# Patient Record
Sex: Female | Born: 1980 | Race: White | Hispanic: No | Marital: Single | Smoking: Former smoker
Health system: Southern US, Community
[De-identification: ages and names within clinical notes are randomized; demographics above are authoritative.]

## PROBLEM LIST (undated history)

## (undated) DIAGNOSIS — I10 Essential (primary) hypertension: Secondary | ICD-10-CM

## (undated) DIAGNOSIS — E119 Type 2 diabetes mellitus without complications: Secondary | ICD-10-CM

## (undated) HISTORY — PX: TUBAL LIGATION: SHX77

---

## 2004-07-08 ENCOUNTER — Emergency Department: Payer: Self-pay | Admitting: Emergency Medicine

## 2005-08-31 ENCOUNTER — Emergency Department: Payer: Self-pay | Admitting: Emergency Medicine

## 2006-02-18 ENCOUNTER — Emergency Department: Payer: Self-pay | Admitting: Emergency Medicine

## 2006-07-13 ENCOUNTER — Emergency Department: Payer: Self-pay | Admitting: Emergency Medicine

## 2007-03-19 ENCOUNTER — Emergency Department: Payer: Self-pay

## 2007-11-23 ENCOUNTER — Emergency Department: Payer: Self-pay | Admitting: Emergency Medicine

## 2008-09-10 ENCOUNTER — Emergency Department: Payer: Self-pay | Admitting: Emergency Medicine

## 2009-05-10 ENCOUNTER — Emergency Department: Payer: Self-pay | Admitting: Emergency Medicine

## 2010-04-08 ENCOUNTER — Emergency Department: Payer: Self-pay | Admitting: Emergency Medicine

## 2011-07-16 ENCOUNTER — Emergency Department: Payer: Self-pay | Admitting: Emergency Medicine

## 2013-04-18 LAB — CBC WITH DIFFERENTIAL/PLATELET
Basophil #: 0.1 10*3/uL (ref 0.0–0.1)
Basophil %: 0.7 %
Eosinophil #: 0.2 10*3/uL (ref 0.0–0.7)
Eosinophil %: 1.9 %
HCT: 40.8 % (ref 35.0–47.0)
HGB: 14 g/dL (ref 12.0–16.0)
Lymphocyte #: 3.7 10*3/uL — ABNORMAL HIGH (ref 1.0–3.6)
Lymphocyte %: 28.7 %
MCH: 31 pg (ref 26.0–34.0)
MCHC: 34.3 g/dL (ref 32.0–36.0)
MCV: 91 fL (ref 80–100)
Monocyte #: 0.8 x10 3/mm (ref 0.2–0.9)
Monocyte %: 6.3 %
Neutrophil #: 7.9 10*3/uL — ABNORMAL HIGH (ref 1.4–6.5)
Neutrophil %: 62.4 %
Platelet: 253 10*3/uL (ref 150–440)
RBC: 4.52 10*6/uL (ref 3.80–5.20)
RDW: 13.3 % (ref 11.5–14.5)
WBC: 12.7 10*3/uL — ABNORMAL HIGH (ref 3.6–11.0)

## 2013-04-18 LAB — BASIC METABOLIC PANEL
Anion Gap: 6 — ABNORMAL LOW (ref 7–16)
BUN: 11 mg/dL (ref 7–18)
Calcium, Total: 9.3 mg/dL (ref 8.5–10.1)
Chloride: 101 mmol/L (ref 98–107)
Co2: 27 mmol/L (ref 21–32)
Creatinine: 0.51 mg/dL — ABNORMAL LOW (ref 0.60–1.30)
EGFR (African American): >60
EGFR (Non-African Amer.): >60
Glucose: 280 mg/dL — ABNORMAL HIGH (ref 65–99)
Osmolality: 278 (ref 275–301)
Potassium: 3.6 mmol/L (ref 3.5–5.1)
Sodium: 134 mmol/L — ABNORMAL LOW (ref 136–145)

## 2013-04-19 ENCOUNTER — Inpatient Hospital Stay: Payer: Self-pay | Admitting: Internal Medicine

## 2013-04-20 LAB — CBC WITH DIFFERENTIAL/PLATELET
Basophil #: 0 10*3/uL (ref 0.0–0.1)
Basophil %: 0.5 %
Eosinophil #: 0 10*3/uL (ref 0.0–0.7)
Eosinophil %: 0.3 %
HCT: 35.6 % (ref 35.0–47.0)
HGB: 12.3 g/dL (ref 12.0–16.0)
Lymphocyte #: 0.5 10*3/uL — ABNORMAL LOW (ref 1.0–3.6)
Lymphocyte %: 11.4 %
MCH: 31 pg (ref 26.0–34.0)
MCHC: 34.7 g/dL (ref 32.0–36.0)
MCV: 90 fL (ref 80–100)
Monocyte #: 0.4 x10 3/mm (ref 0.2–0.9)
Monocyte %: 8.1 %
Neutrophil #: 3.6 10*3/uL (ref 1.4–6.5)
Neutrophil %: 79.7 %
Platelet: 187 10*3/uL (ref 150–440)
RBC: 3.97 10*6/uL (ref 3.80–5.20)
RDW: 13.2 % (ref 11.5–14.5)
WBC: 4.5 10*3/uL (ref 3.6–11.0)

## 2013-04-20 LAB — BASIC METABOLIC PANEL
Anion Gap: 8 (ref 7–16)
BUN: 7 mg/dL (ref 7–18)
Calcium, Total: 8.8 mg/dL (ref 8.5–10.1)
Chloride: 101 mmol/L (ref 98–107)
Co2: 26 mmol/L (ref 21–32)
Creatinine: 0.51 mg/dL — ABNORMAL LOW (ref 0.60–1.30)
EGFR (African American): >60
EGFR (Non-African Amer.): >60
Glucose: 246 mg/dL — ABNORMAL HIGH (ref 65–99)
Osmolality: 276 (ref 275–301)
Potassium: 3.9 mmol/L (ref 3.5–5.1)
Sodium: 135 mmol/L — ABNORMAL LOW (ref 136–145)

## 2013-04-20 LAB — HEMOGLOBIN A1C: Hemoglobin A1C: 10.7 % — ABNORMAL HIGH (ref 4.2–6.3)

## 2013-04-20 LAB — LIPID PANEL
Cholesterol: 130 mg/dL (ref 0–200)
HDL Cholesterol: 24 mg/dL — ABNORMAL LOW (ref 40–60)
Ldl Cholesterol, Calc: 76 mg/dL (ref 0–100)
Triglycerides: 150 mg/dL (ref 0–200)
VLDL Cholesterol, Calc: 30 mg/dL (ref 5–40)

## 2013-04-20 LAB — PREGNANCY, URINE: Pregnancy Test, Urine: NEGATIVE m[IU]/mL

## 2014-05-15 ENCOUNTER — Emergency Department: Payer: Self-pay | Admitting: Emergency Medicine

## 2014-12-06 NOTE — Op Note (Signed)
PATIENT NAME:  Ariana Lindsey, Ariana Lindsey MR#:  [id number] DATE OF BIRTH:  [birth year]  DATE OF PROCEDURE:  04/20/2013  PREOPERATIVE DIAGNOSIS:  Soft tissue abscess of mons pubis.   POSTOPERATIVE DIAGNOSIS:  Soft tissue abscess of mons pubis.   PROCEDURE PERFORMED:  Incision and drainage of abscess on mons pubis.   SURGEON:  Emery Dupuy A. Marina Gravel, M.D.   ASSISTANTS:  None.   ANESTHESIA:  General endotracheal.   FINDINGS:  Pus.   SPECIMENS:  None.   DRAINS:  Penrose drain.   DESCRIPTION OF PROCEDURE:  With informed consent, supine position, general anesthesia, the area around the abscess on the lower abdomen and mons pubis was sterilely prepped and draped with Betadine solution, time-out was observed. There was one large area of necrotic skin. Probing of which demonstrated foul pus. Pus was aspirated. The wound was irrigated. The depth of the wound was approximately 5 cm. It tracked inferiorly and to the right. A counter incision was fashioned. A Penrose drain was placed between these two points. The drains were secured with a nylon suture. Hemostasis was ensured on the operative field, and the patient was then subsequently extubated and taken to the Recovery Room in stable and satisfactory condition by anesthesia services.   ____________________________ Jeannette How Marina Gravel, MD mab:jm D: 04/20/2013 10:41:00 ET T: 04/20/2013 10:53:04 ET JOB#: [id number]  cc: Elta Guadeloupe A. Marina Gravel, MD, <Dictator> Hortencia Conradi MD ELECTRONICALLY SIGNED 04/24/2013 9:45

## 2014-12-06 NOTE — Discharge Summary (Signed)
PATIENT NAME:  Ariana Lindsey, Ariana Lindsey MR#:  [id number] DATE OF BIRTH:  01-29-1981  DATE OF ADMISSION:  04/19/2013 DATE OF DISCHARGE:  04/21/2013  DISCHARGE DIAGNOSES: 1.  Mons pubis abscess status post drainage.  2.  Diabetes mellitus type 2.  3.  Obesity.   DISCHARGE MEDICATIONS: 1.  Glipizide 5 mg p.o. b.i.d. 2.  Metformin 5 mg p.o. b.i.d. 3.  Cleocin 300 mg q. 6 hours for 10 days.   DIET:  ADA diet.   CONSULTATIONS:  Surgical consult with Dr. Marina Gravel.   HOSPITAL COURSE: This is a 34 year old female patient with a history of diabetes before stopped taking medications as she lost some weight, came in because of swelling around the mons pubis area, gradually gotten worse with fever and nausea. The patient has a history of boils underarms and also along groin area. Usually they resolve by itself but this abscess is going on for a week and also associated significant pain. Admitted for soft tissue swelling, cellulitis of mons pubis area. Initially, the patient started on IV clindamycin, was then admitted to medical floor ,obgyn consult  obtained and also surgical consult obtained. CAT scan of the abdomen and pelvis showed about inflammation around mons pubis area without abscess. White count on admission was 12.7. The patient continued to spike fever, 102, and maximum fever was 103. The patient seen by Dr. Burt Knack initially and taken to OR by Dr. Marina Gravel yesterday; and the patient had a large area of necrotic skin andlot of pus was drained  from there,and .   wound was around 5 cm.deep/ The patient's wound was irrigated and a Penrose drain was placed between these 2 points. The patient tolerated the procedure well, monitored for about 24 hours after the procedure; and since surgery, the patient did not have any fevers, stayed hemodynamically stable and the patient's pain is better.  Redness and swelling at the mons pubis area also improved.  Seen by surgery today.  Recommended that she can go home and followup  with Dr. Marina Gravel on September 11. The patient's cellulitis is nearly resolved.  So we discharged her with clindamycin.   Diabetes mellitus type II.  The patient stopped taking her metformin and glipizide a few years ago due to the fact that she lost weight.  But here we got hemoglobin A1c to around 10.  The patient's hemoglobin Alc 10.7. LDL is at goal. Started back on metformin and glipizide. Told her that she needs to have a primary doctor and she needs follow up on diabetes and she also knows about how to check sugars and she is aware of her diabetic diet.  The patient went home in stable condition. Initially white count was 12.7, and kidney function was within normal limits. The patient's white count came back to 4.7 September 5.  TIME SPENT ON DISCHARGE PREPARATION:  More than 30 minutes.    ____________________________ Epifanio Lesches, MD sk:ce D: 04/21/2013 14:12:30 ET T: 04/21/2013 15:03:32 ET JOB#: [id number]  cc: Epifanio Lesches, MD, <Dictator> Mark A. Marina Gravel, MD  Epifanio Lesches MD ELECTRONICALLY SIGNED 05/06/2013 15:47

## 2014-12-06 NOTE — Consult Note (Signed)
PATIENT NAME:  Ariana Lindsey, Ariana Lindsey MR#:  [id number] DATE OF BIRTH:  [birth year]  DATE OF CONSULTATION:  04/19/2013  CONSULTING PHYSICIAN:  Jerrol Banana. Burt Knack, MD  CHIEF COMPLAINT: Draining abscesses of the pubis.    HISTORY OF PRESENT ILLNESS: This is a 34 year old morbidly obese female patient with a history of multiple boils in the past, but over the last week, she has had increasing pain, swelling, fevers and spontaneous drainage. She has had packing placed into two separate ares on the pubis in the Emergency Room yesterday. She continues to have pain, states that she can barely walk at this point. She has had abscess or boils in the past, but it has never been this bad.    PAST MEDICAL HISTORY: Hypertension and diabetes and obesity.   PAST SURGICAL HISTORY: None.   ALLERGIES: None.   MEDICATIONS: Multiple, see chart and reconciliation.   FAMILY HISTORY: Noncontributory.   PAST SURGICAL HISTORY: C-section and tubal ligation.   SOCIAL HISTORY: The patient smokes 1/2 pack of cigarettes per day. Rarely drinks alcohol.   REVIEW OF SYSTEMS: A 10-system review is performed and negative with the exception that mentioned in the history of present illness.   PHYSICAL EXAMINATION: GENERAL: Obese female patient with a BMI of 39, 240 pounds, 66 inches.  VITAL SIGNS: Temperature 100.9, pulse 102, respirations 20, blood pressure 128/82, 96% room air sat. HEENT: No scleral icterus.  NECK: No palpable neck nodes.  CHEST: Clear to auscultation.  CARDIAC: Regular rate and rhythm.  ABDOMEN: Obese, soft, nontender. In the pubic area there is extensive erythema, induration, two spontaneous drainage appear to have been packed with 0.25 inch Nu Gauze. There is a third and possibly even a fourth area that looks like it is fluctuant and possibly ready point and considerable induration and tenderness with erythema.  EXTREMITIES: Without edema. Calves are nontender.  NEUROLOGIC: Grossly intact.  INTEGUMENT:  Otherwise shows no jaundice.   LABORATORY VALUES:  White blood cell count of 12.7, hemoglobin and hematocrit of 14 and 41 and a platelet count of 253. Electrolytes are within normal limits.   ASSESSMENT AND PLAN: This is a patient with multiple abscesses of the mons pubis. Recommend taking the patient the operating room for formal drainage. I suspect that these connect the deeper space, but nonetheless will require drainage because of the multiplicity and the severity with her diabetes and obesity and tobacco abuse. The options were discussed with her as was the rationale for offering this approach and the risks of bleeding, infection, open wound, cosmetic deformity and recurrence of symptoms were all reviewed with her. She understood and agreed to proceed.   I also notified her that this will be scheduled for tomorrow morning. Nothing oral orders were placed and this will be scheduled with Dr. Marina Gravel.    ____________________________ Jerrol Banana. Burt Knack, MD rec:cc D: 04/19/2013 21:18:35 ET T: 04/19/2013 22:19:37 ET JOB#: [id number]  cc: Jerrol Banana. Burt Knack, MD, <Dictator> Florene Glen MD ELECTRONICALLY SIGNED 04/19/2013 22:43

## 2014-12-06 NOTE — Consult Note (Signed)
Brief Consult Note: Diagnosis: Cellulitis of mons pubis.   Patient was seen by consultant.   Consult note dictated.   Recommend further assessment or treatment.   Discussed with Attending MD.   Comments: Gen Surgery consult as well due to risk for necrosis, possible need for debridement.  Electronic Signatures: Hoyt Koch (MD)  (Signed 04-Sep-14 09:25)  Authored: Brief Consult Note   Last Updated: 04-Sep-14 09:25 by Hoyt Koch (MD)

## 2014-12-06 NOTE — H&P (Signed)
PATIENT NAME:  Ariana Lindsey, Ariana Lindsey MR#:  [id number] DATE OF BIRTH:  [birth year]  DATE OF ADMISSION:  04/19/2013  PRIMARY CARE PHYSICIAN:  Nonlocal.   REFERRING PHYSICIAN:  Dr. Lovena Le.   CHIEF COMPLAINT:  Redness and swelling of the mons pubis.   HISTORY OF PRESENT ILLNESS:  The patient is a 34 year old Caucasian female with past medical history of diabetes mellitus and hypertension, has noticed a small pustular area on the mons pubis approximately one week ago, slowly it became more erythematous, tender and swollen.  Subsequently, the patient started having difficulty with ambulation.  This is associated with fever and nausea.  She is reporting that she is prone for abscess formation and last year she was diagnosed with abscess 3 times.  She denies any history of MRSA.  She usually gets abscesses in the axillary area and groin area.  She denies any vaginal discharge.  No similar complaints in the past.  The patient denies any trauma other areas, but reporting that she has shaved that area approximately two weeks ago.  CAT scan of the pelvis without IV contrast has revealed inflammatory changes in the mons pubis without abscess.  Findings are consistent with cellulitis.  Hospitalist team is called to admit the patient.  The patient has received IV clindamycin in the ER, although pain was alleviated with IV morphine.  During my examination, with the help of chaperone ER nurse Mr. Warner Mccreedy, the patient's pain is much better and started feeling better.  No family members were at bedside.   PAST MEDICAL HISTORY:  Diabetes mellitus, hypertension.   PAST SURGICAL HISTORY:  C-section, tubal ligation.   ALLERGIES:  She has no known drug allergies.   PSYCHOSOCIAL HISTORY:  Lives with children, smokes half pack a day.  Occasional intake of alcohol.  Denies any illicit drug usage.   FAMILY HISTORY:  Grandmother has history of diabetes mellitus and mom and dad has history of hypertension.   HOME MEDICATIONS:  Not on  any medications currently.   REVIEW OF SYSTEMS:  CONSTITUTIONAL:  Complaining of fever and fatigue.  EYES:  Denies blurry vision, glaucoma.  EARS, NOSE, THROAT:  No epistaxis or discharge.  RESPIRATION:  No cough, COPD.  CARDIOVASCULAR:  Denies chest pain, palpitations.  GASTROINTESTINAL:  Had nausea, but denies any vomiting or diarrhea.  Denies any abdominal pain.  GENITOURINARY:  No dysuria or hematuria.   GYNECOLOGIC AND BREAST:  Denies any breast mass or vaginal discharge.   ENDOCRINE:  Denies polyuria, nocturia.  Has history of diabetes mellitus.  HEMATOLOGIC AND LYMPHATIC:  Denies anemia, easy bruising.  INTEGUMENTARY:  No acne, has two open lesions in the mons pubis area associated with erythema, tenderness and fluctuance.  MUSCULOSKELETAL:  Denies any pain in the neck, back, shoulder.  Denies gout.  NEUROLOGIC:  No vertigo, ataxia.  PSYCHIATRIC:  No ADD, OCD.   PHYSICAL EXAMINATION: VITAL SIGNS:  Temperature 98.5%, pulse 92, respiration 18, blood pressure 117/72, pulse ox 96%.  GENERAL APPEARANCE:  Obese.  Moderately built and obese lady not under acute distress.  HEENT:  Normocephalic, atraumatic.  Pupils are equally reactive to light and accommodation.  No scleral icterus.  No conjunctival injection.  No sinus tenderness.  No postnasal drip.  Moist mucous membranes.  NECK:  Supple.  No JVD.  No thyromegaly.  No lymphadenopathy.  LUNGS:  Clear to auscultation bilaterally.  No accessory muscle usage.  No anterior chest wall tenderness on palpation.  CARDIAC:  S1, S2 normal.  Regular rate and  rhythm.  No murmurs.  GASTROINTESTINAL:  Soft, obese.  Bowel sounds are positive in all four quadrants.  Nontender, nondistended.  No hepatosplenomegaly.  NEUROLOGIC:  Awake, alert, oriented x 3.  Motor and sensory grossly intact.  Reflexes are 2+.  EXTREMITIES:  No edema.  No cyanosis.  No clubbing.  MUSCULOSKELETAL:  No joint effusion, tenderness, erythema.  PSYCHIATRIC:  Normal mood and  affect.  SKIN:  Warm to touch.  Normal turgor.  Two open lesions were noticed in the mons pubis area, one is 2 x 2 cm.  The other one is 1 x 0.5 cm in size.  A little purulent discharge is noted, erythematous and tender, positive fluctuance.   LABORATORY AND IMAGING STUDIES:  Glucose 280, BUN 11, creatinine 0.77, creatinine 0.51, sodium 134, potassium 3.6, chloride 111, CO2 27, anion gap 6, GFR greater than 60, serum osmolality 278, calcium 9.3.  WBC 12.7, hemoglobin 14.0, hematocrit 40.8, platelets 253.  CT of the pelvis without contrast has revealed inflammatory changes in the mons pubis without abscess.  Findings consistent with cellulitis.   ASSESSMENT AND PLAN:  A 34 year old female with swelling, erythema and pain in the mons pubis, will be admitted with the following assessment and plan.  1.  Cellulitis with soft tissue swelling of the mons pubis.  We will admit her to Med-Surg unit.  We will obtain wound cultures.  We will continue IV clindamycin and Zosyn.  We will put a gynecologic consult as the patient has vulvar edema.  2.  Diabetes mellitus.  The patient is not on any home medications.  We will check hemoglobin A1c and patient will be on insulin sliding scale.  3.  Hypertension.  The patient is not on any home medication.  We will monitor blood pressures closely and if necessary we will add medication.  4.  We will provide her GI and DVT prophylaxis.   Plan of care discussed in detail with the patient.  She is aware of the plan.     ____________________________ Nicholes Mango, MD ag:ea D: 04/19/2013 05:51:00 ET T: 04/19/2013 06:24:52 ET JOB#: [id number]  cc: Nicholes Mango, MD, <Dictator> Nicholes Mango MD ELECTRONICALLY SIGNED 04/20/2013 7:05

## 2014-12-06 NOTE — Consult Note (Signed)
Brief Consult Note: Diagnosis: multiple abscesses of pubis.   Patient was seen by consultant.   Consult note dictated.   Recommend to proceed with surgery or procedure.   Orders entered.   Comments: rec I&D risks and options discussed agrees with plan.  Electronic Signatures: Florene Glen (MD)  (Signed 04-Sep-14 21:12)  Authored: Brief Consult Note   Last Updated: 04-Sep-14 21:12 by Florene Glen (MD)

## 2014-12-06 NOTE — Consult Note (Signed)
PATIENT NAME:  Ariana Lindsey, Ariana Lindsey MR#:  [id number] DATE OF BIRTH:  Oct 12, 1980  DATE OF CONSULTATION:  04/19/2013  REFERRING PHYSICIAN:  Epifanio Lesches, MD CONSULTING PHYSICIAN:  Glean Salen, MD  HISTORY OF PRESENT ILLNESS: The patient is a 34 year old white female with a past medical history significant for diabetes and hypertension as well as frequent episodes of hidradenitis suppurativa, although she has not been seen recently by a physician for these conditions. Recently the patient has been noting some lesions of a tender erythematous nature in her mons pubis. These got to the point where they did have some drainage and she noticed some fever and nausea as well. She has had prior infections, even abscesses, with lesions of this nature and so came to the ER for further evaluation. She denies a history of MRSA. She has no other lesions at this time. The patient was seen in the Emergency Room and these lesions were drained and packed with gauze, and she also had a CT scan which ruled out any signs of abscess, although cellulitis was confirmed. The patient was placed on IV antibiotics including clindamycin and Zosyn and was admitted for further therapy. This morning the patient reports continued pain and swelling in this area, but no other complaints.   PAST MEDICAL HISTORY: Diabetes, hypertension, hidradenitis.   PAST SURGICAL HISTORY: Cesarean section, tubal ligation.  HOME MEDICATIONS: None.   ALLERGIES: No known drug allergies   SOCIAL HISTORY: The patient is a smoker. Occasional alcohol use.   FAMILY HISTORY: Diabetes and hypertension.   REVIEW OF SYSTEMS: As above. No other changes.   PHYSICAL EXAMINATION: VITAL SIGNS: Stable. No fever this morning.  GENERAL: No acute distress.  PSYCH: Alert and oriented.  ABDOMEN: Soft, nontender, nondistended, obese.  EXTREMITIES: No edema.  GENITOURINARY: The patient has significant edema in the mons pubis area, although it does not extend  into the labia or into the vagina. There is erythema to the skin in the mons pubis. There are 2 lesions that have been packed in the ER with some iodoform gauze. The midline lesion has some blackened edges on the surface. It is very tender to examine the patient in this area. There is some scant drainage at the time of examination from these sores.   LABORATORY AND DIAGNOSTICS: White blood cell 12.7.  See CT report.  ASSESSMENT: A 34 year old patient with history of hydradenitis as well as diabetes now with 2 draining lesions in the mons pubis. The patient certainly has cellulitis. There is no evidence for abscess. There is concern for necrotic skin edges on 1 of the lesions.   PLAN: I would like general surgery to also evaluate these lesions for possible need for debridement, or incision and drainage procedure. Antibiotics are agreed upon and should continue for at least 48 hours before switching to a p.o. regimen and outpatient treatment. Certainly we need to see resolution in these lesions and improvement before p.o. therapy and then outpatient therapy. I also agree that diabetes management needs to be more thoroughly undertaken, and the patient needs to be more compliant with follow up and medications. We will continue to follow the patient and follow the advice of the general surgeon through his consultation as well.  ____________________________ R. Barnett Applebaum, MD rph:sb D: 04/19/2013 09:31:12 ET T: 04/19/2013 09:43:11 ET JOB#: [id number]  cc: Glean Salen, MD, <Dictator> Gae Dry MD ELECTRONICALLY SIGNED 04/19/2013 10:37

## 2015-04-14 ENCOUNTER — Encounter
Admission: RE | Admit: 2015-04-14 | Discharge: 2015-04-14 | Disposition: A | Discharge status: Home / Self Care | Payer: 59 | Source: Ambulatory Visit | Attending: Obstetrics & Gynecology | Admitting: Obstetrics & Gynecology

## 2015-04-14 DIAGNOSIS — Z01818 Encounter for other preprocedural examination: Secondary | ICD-10-CM | POA: Diagnosis not present

## 2015-04-14 HISTORY — DX: Type 2 diabetes mellitus without complications: E11.9

## 2015-04-14 HISTORY — DX: Essential (primary) hypertension: I10

## 2015-04-14 LAB — TYPE AND SCREEN
ABO/RH(D): A POS
Antibody Screen: NEGATIVE

## 2015-04-14 LAB — BASIC METABOLIC PANEL
Anion gap: 9 (ref 5–15)
BUN: 14 mg/dL (ref 6–20)
CO2: 24 mmol/L (ref 22–32)
Calcium: 9.3 mg/dL (ref 8.9–10.3)
Chloride: 101 mmol/L (ref 101–111)
Creatinine, Ser: 0.52 mg/dL (ref 0.44–1.00)
GFR calc Af Amer: >60 mL/min (ref >60)
GFR calc non Af Amer: >60 mL/min (ref >60)
Glucose, Bld: 362 mg/dL — ABNORMAL HIGH (ref 65–99)
Potassium: 4.2 mmol/L (ref 3.5–5.1)
Sodium: 134 mmol/L — ABNORMAL LOW (ref 135–145)

## 2015-04-14 LAB — CBC
HCT: 45 % (ref 35.0–47.0)
Hemoglobin: 15 g/dL (ref 12.0–16.0)
MCH: 29.8 pg (ref 26.0–34.0)
MCHC: 33.4 g/dL (ref 32.0–36.0)
MCV: 89.3 fL (ref 80.0–100.0)
Platelets: 278 10*3/uL (ref 150–440)
RBC: 5.04 MIL/uL (ref 3.80–5.20)
RDW: 13.5 % (ref 11.5–14.5)
WBC: 9.4 10*3/uL (ref 3.6–11.0)

## 2015-04-14 LAB — ABO/RH: ABO/RH(D): A POS

## 2015-04-14 NOTE — Patient Instructions (Signed)
Your procedure is scheduled on: Thursday 04/17/2015 Report to Day Surgery. 2ND FLOOR MEDICAL MALL ENTRANCE To find out your arrival time please call [phone] between 1PM - 3PM on Wednesday 04/16/2015.  Remember: Instructions that are not followed completely may result in serious medical risk, up to and including death, or upon the discretion of your surgeon and anesthesiologist your surgery may need to be rescheduled.    __X__ 1. Do not eat food or drink liquids after midnight. No gum chewing or hard candies.     __X__ 2. No Alcohol for 24 hours before or after surgery.   ____ 3. Bring all medications with you on the day of surgery if instructed.    __X__ 4. Notify your doctor if there is any change in your medical condition     (cold, fever, infections).     Do not wear jewelry, make-up, hairpins, clips or nail polish.  Do not wear lotions, powders, or perfumes.   Do not shave 48 hours prior to surgery. Men may shave face and neck.  Do not bring valuables to the hospital.    [facility] is not responsible for any belongings or valuables.               Contacts, dentures or bridgework may not be worn into surgery.  Leave your suitcase in the car. After surgery it may be brought to your room.  For patients admitted to the hospital, discharge time is determined by your                treatment team.   Patients discharged the day of surgery will not be allowed to drive home.   Please read over the following fact sheets that you were given:   Surgical Site Infection Prevention   ____ Take these medicines the morning of surgery with A SIP OF WATER:    1.   2.   3.   4.  5.  6.  ____ Fleet Enema (as directed)   __X__ Use CHG Soap as directed  ____ Use inhalers on the day of surgery  __X__ Stop metformin 2 days prior to surgery    ____ Take 1/2 of usual insulin dose the night before surgery and none on the morning of surgery.   ____ Stop Coumadin/Plavix/aspirin on   _  __ Stop Anti-inflammatories on    ____ Stop supplements until after surgery.    ____ Bring C-Pap to the hospital.   Pain Relief Preoperatively and Postoperatively Being a good patient does not mean being a silent one.If you have questions, problems, or concerns about the pain you may feel after surgery, let your caregiver know.Patients have the right to assessment and management of pain. The treatment of pain after surgery is important to speed up recovery and return to normal activities. Severe pain after surgery, and the fear or anxiety associated with that pain, may cause extreme discomfort that:  Prevents sleep.  Decreases the ability to breathe deeply and cough. This can cause pneumonia or other upper airway infections.  Causes your heart to beat faster and your blood pressure to be higher.  Increases the risk for constipation and bloating.  Decreases the ability of wounds to heal.  May result in depression, increased anxiety, and feelings of helplessness. Relief of pain before surgery is also important because it will lessen the pain after surgery. Patients who receive both pain relief before and after surgery experience greater pain relief than those who only receive  pain relief after surgery. Let your caregiver know if you are having uncontrolled pain.This is very important.Pain after surgery is more difficult to manage if it is permitted to become severe, so prompt and adequate treatment of acute pain is necessary. PAIN CONTROL METHODS Your caregivers follow policies and procedures about the management of patient pain.These guidelines should be explained to you before surgery.Plans for pain control after surgery must be mutually decided upon and instituted with your full understanding and agreement.Do not be afraid to ask questions regarding the care you are receiving.There are many different ways your caregivers will attempt to control your pain, including the following  methods. As needed pain control  You may be given pain medicine either through your intravenous (IV) tube, or as a pill or liquid you can swallow. You will need to let your caregiver know when you are having pain. Then, your caregiver will give you the pain medicine ordered for you.  Your pain medicine may make you constipated. If constipation occurs, drink more liquids if you can. Your caregiver may have you take a mild laxative. IV patient-controlled analgesia pump (PCA pump)  You can get your pain medicine through the IV tube which goes into your vein. You are able to control the amount of pain medicine that you get. The pain medicine flows in through an IV tube and is controlled by a pump. This pump gives you a set amount of pain medicine when you push the button hooked up to it. Nobody should push this button but you or someone specifically assigned by you to do so. It is set up to keep you from accidentally giving yourself too much pain medicine. You will be able to start using your pain pump in the recovery room after your surgery. This method can be helpful for most types of surgery.  If you are still having too much pain, tell your caregiver. Also, tell your caregiver if you are feeling too sleepy or nauseous. Continuous epidural pain control  A thin, soft tube (catheter) is put into your back. Pain medicine flows through the catheter to lessen pain in the part of your body where the surgery is done. Continuous epidural pain control may work best for you if you are having surgery on your chest, abdomen, hip area, or legs. The epidural catheter is usually put into your back just before surgery. The catheter is left in until you can eat and take medicine by mouth. In most cases, this may take 2 to 3 days.  Giving pain medicine through the epidural catheter may help you heal faster because:  Your bowel gets back to normal faster.  You can get back to eating sooner.  You can be up and  walking sooner. Medicine that numbs the area (local anesthetic)  You may receive an injection of pain medicine near where the pain is (local infiltration).  You may receive an injection of pain medicine near the nerve that controls the sensation to a specific part of the body (peripheral nerve block).  Medicine may be put in the spine to block pain (spinal block). Opioids  Moderate to moderately severe acute pain after surgery may respond to opioids.Opioids are narcotic pain medicine. Opioids are often combined with non-narcotic medicines to improve pain relief, diminish the risk of side effects, and reduce the chance of addiction.  If you follow your caregiver's directions about taking opioids and you do not have a history of substance abuse, your risk of becoming addicted is  exceptionally small.Opioids are given for short periods of time in careful doses to prevent addiction. Other methods of pain control include:  Steroids.  Physical therapy.  Heat and cold therapy.  Compression, such as wrapping an elastic bandage around the area of pain.  Massage. These various ways of controlling pain may be used together. Combining different methods of pain control is called multimodal analgesia. Using this approach has many benefits, including being able to eat, move around, and leave the hospital sooner. Document Released: 10/23/2002 Document Revised: 10/25/2011 Document Reviewed: 10/27/2010 [facility] Patient Information 2015 [place], [place]. This information is not intended to replace advice given to you by your health care provider. Make sure you discuss any questions you have with your health care provider.

## 2015-04-15 LAB — HEMOGLOBIN A1C: Hgb A1c MFr Bld: 11.5 % — ABNORMAL HIGH (ref 4.0–6.0)

## 2015-04-17 ENCOUNTER — Ambulatory Visit: Admission: RE | Admit: 2015-04-17 | Payer: 59 | Source: Ambulatory Visit | Admitting: Obstetrics & Gynecology

## 2015-04-17 ENCOUNTER — Encounter: Admission: RE | Payer: Self-pay | Source: Ambulatory Visit

## 2015-04-17 SURGERY — HYSTERECTOMY, TOTAL, LAPAROSCOPIC
Anesthesia: Choice | Laterality: Bilateral

## 2015-05-26 ENCOUNTER — Encounter
Admission: RE | Admit: 2015-05-26 | Discharge: 2015-05-26 | Disposition: A | Discharge status: Home / Self Care | Payer: 59 | Source: Ambulatory Visit | Attending: Obstetrics & Gynecology | Admitting: Obstetrics & Gynecology

## 2015-05-26 DIAGNOSIS — Z7984 Long term (current) use of oral hypoglycemic drugs: Secondary | ICD-10-CM | POA: Diagnosis not present

## 2015-05-26 DIAGNOSIS — Z803 Family history of malignant neoplasm of breast: Secondary | ICD-10-CM | POA: Diagnosis not present

## 2015-05-26 DIAGNOSIS — Z79899 Other long term (current) drug therapy: Secondary | ICD-10-CM | POA: Diagnosis not present

## 2015-05-26 DIAGNOSIS — N838 Other noninflammatory disorders of ovary, fallopian tube and broad ligament: Secondary | ICD-10-CM | POA: Diagnosis not present

## 2015-05-26 DIAGNOSIS — Z8249 Family history of ischemic heart disease and other diseases of the circulatory system: Secondary | ICD-10-CM | POA: Diagnosis not present

## 2015-05-26 DIAGNOSIS — E119 Type 2 diabetes mellitus without complications: Secondary | ICD-10-CM | POA: Diagnosis not present

## 2015-05-26 DIAGNOSIS — N72 Inflammatory disease of cervix uteri: Secondary | ICD-10-CM | POA: Diagnosis not present

## 2015-05-26 DIAGNOSIS — Z794 Long term (current) use of insulin: Secondary | ICD-10-CM | POA: Diagnosis not present

## 2015-05-26 DIAGNOSIS — N920 Excessive and frequent menstruation with regular cycle: Secondary | ICD-10-CM | POA: Diagnosis not present

## 2015-05-26 DIAGNOSIS — D252 Subserosal leiomyoma of uterus: Secondary | ICD-10-CM | POA: Diagnosis not present

## 2015-05-26 DIAGNOSIS — F17210 Nicotine dependence, cigarettes, uncomplicated: Secondary | ICD-10-CM | POA: Diagnosis not present

## 2015-05-26 DIAGNOSIS — I10 Essential (primary) hypertension: Secondary | ICD-10-CM | POA: Diagnosis not present

## 2015-05-26 DIAGNOSIS — D251 Intramural leiomyoma of uterus: Secondary | ICD-10-CM | POA: Diagnosis not present

## 2015-05-26 DIAGNOSIS — R102 Pelvic and perineal pain: Secondary | ICD-10-CM | POA: Diagnosis present

## 2015-05-26 DIAGNOSIS — G8929 Other chronic pain: Secondary | ICD-10-CM | POA: Diagnosis not present

## 2015-05-26 LAB — BASIC METABOLIC PANEL
Anion gap: 6 (ref 5–15)
BUN: 14 mg/dL (ref 6–20)
CO2: 25 mmol/L (ref 22–32)
Calcium: 8.9 mg/dL (ref 8.9–10.3)
Chloride: 107 mmol/L (ref 101–111)
Creatinine, Ser: 0.45 mg/dL (ref 0.44–1.00)
GFR calc Af Amer: >60 mL/min (ref >60)
GFR calc non Af Amer: >60 mL/min (ref >60)
Glucose, Bld: 131 mg/dL — ABNORMAL HIGH (ref 65–99)
Potassium: 3.7 mmol/L (ref 3.5–5.1)
Sodium: 138 mmol/L (ref 135–145)

## 2015-05-26 LAB — CBC
HCT: 37.5 % (ref 35.0–47.0)
Hemoglobin: 12.6 g/dL (ref 12.0–16.0)
MCH: 30.3 pg (ref 26.0–34.0)
MCHC: 33.7 g/dL (ref 32.0–36.0)
MCV: 90 fL (ref 80.0–100.0)
Platelets: 297 10*3/uL (ref 150–440)
RBC: 4.17 MIL/uL (ref 3.80–5.20)
RDW: 13.8 % (ref 11.5–14.5)
WBC: 11.6 10*3/uL — ABNORMAL HIGH (ref 3.6–11.0)

## 2015-05-26 LAB — SURGICAL PCR SCREEN
MRSA, PCR: NEGATIVE
Staphylococcus aureus: POSITIVE — AB

## 2015-05-26 LAB — TYPE AND SCREEN
ABO/RH(D): A POS
Antibody Screen: NEGATIVE

## 2015-05-26 LAB — HEMOGLOBIN A1C: Hgb A1c MFr Bld: 8.8 % — ABNORMAL HIGH (ref 4.0–6.0)

## 2015-05-26 MED ORDER — HEPARIN SODIUM (PORCINE) 5000 UNIT/ML IJ SOLN: 5000.0000 [IU] | Freq: Once | INTRAMUSCULAR | Status: DC

## 2015-05-26 MED ORDER — CEFAZOLIN SODIUM-DEXTROSE 2-3 GM-% IV SOLR: 2.0000 g | Freq: Once | INTRAVENOUS | Status: DC

## 2015-05-26 NOTE — Patient Instructions (Addendum)
  Your procedure is scheduled on: 05/29/15 Thurs Report to Day Surgery. To find out your arrival time please call [phone] between 1PM - 3PM on 05/28/15 Wed.  Remember: Instructions that are not followed completely may result in serious medical risk, up to and including death, or upon the discretion of your surgeon and anesthesiologist your surgery may need to be rescheduled.    __x__ 1. Do not eat food or drink liquids after midnight. No gum chewing or hard candies.     ____ 2. No Alcohol for 24 hours before or after surgery.   ____ 3. Bring all medications with you on the day of surgery if instructed.    __x__ 4. Notify your doctor if there is any change in your medical condition     (cold, fever, infections).     Do not wear jewelry, make-up, hairpins, clips or nail polish.  Do not wear lotions, powders, or perfumes. You may wear deodorant.  Do not shave 48 hours prior to surgery. Men may shave face and neck.  Do not bring valuables to the hospital.    [facility] is not responsible for any belongings or valuables.               Contacts, dentures or bridgework may not be worn into surgery.  Leave your suitcase in the car. After surgery it may be brought to your room.  For patients admitted to the hospital, discharge time is determined by your                treatment team.   Patients discharged the day of surgery will not be allowed to drive home.   Please read over the following fact sheets that you were given:   MRSA booklet   ____ Take these medicines the morning of surgery with A SIP OF WATER:    1.None  2.   3.   4.  5.  6.  ____ Fleet Enema (as directed)   ____ Use CHG Soap as directed  ____ Use inhalers on the day of surgery  _x___ Stop metformin 2 days prior to surgery    ____ Take 1/2 of usual insulin dose the night before surgery and none on the morning of surgery.   ____ Stop Coumadin/Plavix/aspirin on   ____ Stop Anti-inflammatories on     ____ Stop supplements until after surgery.    ____ Bring C-Pap to the hospital.

## 2015-05-27 NOTE — OR Nursing (Signed)
Positive staph aureus called and faxed to [place] at dr wards office, paged dr ward,out of office this pm. Requested nurse give info today to doctor in office

## 2015-05-29 ENCOUNTER — Ambulatory Visit: Payer: 59 | Admitting: Anesthesiology

## 2015-05-29 ENCOUNTER — Encounter
Admission: RE | Disposition: A | Discharge status: Home / Self Care | Payer: Self-pay | Source: Ambulatory Visit | Attending: Obstetrics & Gynecology

## 2015-05-29 ENCOUNTER — Ambulatory Visit
Admission: RE | Admit: 2015-05-29 | Discharge: 2015-05-30 | Disposition: A | Discharge status: Home / Self Care | Payer: 59 | Source: Ambulatory Visit | Attending: Obstetrics & Gynecology | Admitting: Obstetrics & Gynecology

## 2015-05-29 ENCOUNTER — Encounter: Payer: Self-pay | Admitting: *Deleted

## 2015-05-29 DIAGNOSIS — Z90710 Acquired absence of both cervix and uterus: Secondary | ICD-10-CM | POA: Diagnosis present

## 2015-05-29 DIAGNOSIS — Z794 Long term (current) use of insulin: Secondary | ICD-10-CM | POA: Insufficient documentation

## 2015-05-29 DIAGNOSIS — N920 Excessive and frequent menstruation with regular cycle: Secondary | ICD-10-CM | POA: Insufficient documentation

## 2015-05-29 DIAGNOSIS — Z8249 Family history of ischemic heart disease and other diseases of the circulatory system: Secondary | ICD-10-CM | POA: Insufficient documentation

## 2015-05-29 DIAGNOSIS — N72 Inflammatory disease of cervix uteri: Secondary | ICD-10-CM | POA: Insufficient documentation

## 2015-05-29 DIAGNOSIS — N838 Other noninflammatory disorders of ovary, fallopian tube and broad ligament: Secondary | ICD-10-CM | POA: Insufficient documentation

## 2015-05-29 DIAGNOSIS — Z7984 Long term (current) use of oral hypoglycemic drugs: Secondary | ICD-10-CM | POA: Insufficient documentation

## 2015-05-29 DIAGNOSIS — D252 Subserosal leiomyoma of uterus: Secondary | ICD-10-CM | POA: Insufficient documentation

## 2015-05-29 DIAGNOSIS — E119 Type 2 diabetes mellitus without complications: Secondary | ICD-10-CM | POA: Insufficient documentation

## 2015-05-29 DIAGNOSIS — D251 Intramural leiomyoma of uterus: Secondary | ICD-10-CM | POA: Insufficient documentation

## 2015-05-29 DIAGNOSIS — Z79899 Other long term (current) drug therapy: Secondary | ICD-10-CM | POA: Insufficient documentation

## 2015-05-29 DIAGNOSIS — I10 Essential (primary) hypertension: Secondary | ICD-10-CM | POA: Insufficient documentation

## 2015-05-29 DIAGNOSIS — Z803 Family history of malignant neoplasm of breast: Secondary | ICD-10-CM | POA: Insufficient documentation

## 2015-05-29 DIAGNOSIS — G8929 Other chronic pain: Secondary | ICD-10-CM | POA: Insufficient documentation

## 2015-05-29 DIAGNOSIS — F17210 Nicotine dependence, cigarettes, uncomplicated: Secondary | ICD-10-CM | POA: Insufficient documentation

## 2015-05-29 DIAGNOSIS — R102 Pelvic and perineal pain: Secondary | ICD-10-CM | POA: Insufficient documentation

## 2015-05-29 HISTORY — PX: LAPAROSCOPIC HYSTERECTOMY: SHX1926

## 2015-05-29 LAB — GLUCOSE, CAPILLARY
Glucose-Capillary: 192 mg/dL — ABNORMAL HIGH (ref 65–99)
Glucose-Capillary: 238 mg/dL — ABNORMAL HIGH (ref 65–99)
Glucose-Capillary: 218 mg/dL — ABNORMAL HIGH (ref 65–99)
Glucose-Capillary: 217 mg/dL — ABNORMAL HIGH (ref 65–99)

## 2015-05-29 LAB — POCT PREGNANCY, URINE: Preg Test, Ur: NEGATIVE

## 2015-05-29 SURGERY — HYSTERECTOMY, TOTAL, LAPAROSCOPIC
Anesthesia: General

## 2015-05-29 MED ORDER — ACETAMINOPHEN 325 MG PO TABS: 650.0000 mg | ORAL_TABLET | ORAL | Status: DC

## 2015-05-29 MED ORDER — INSULIN GLARGINE 100 UNIT/ML ~~LOC~~ SOLN
60.0000 [IU] | Freq: Every day | SUBCUTANEOUS | Status: DC
  Administered 2015-05-30: 60 [IU] via SUBCUTANEOUS
  Filled 2015-05-29: qty 0.6

## 2015-05-29 MED ORDER — NEOSTIGMINE METHYLSULFATE 10 MG/10ML IV SOLN
INTRAVENOUS | Status: DC | PRN
  Administered 2015-05-29: 4 mg via INTRAVENOUS

## 2015-05-29 MED ORDER — MENTHOL 3 MG MT LOZG: 1.0000 | LOZENGE | OROMUCOSAL | Status: DC | PRN

## 2015-05-29 MED ORDER — SODIUM CHLORIDE 0.9 % IV SOLN
50.0000 mL/h | INTRAVENOUS | Status: DC
  Administered 2015-05-29: 50 mL/h via INTRAVENOUS

## 2015-05-29 MED ORDER — PHENYLEPHRINE HCL 10 MG/ML IJ SOLN
INTRAMUSCULAR | Status: DC | PRN
  Administered 2015-05-29: 200 ug via INTRAVENOUS
  Administered 2015-05-29 (×2): 100 ug via INTRAVENOUS

## 2015-05-29 MED ORDER — EPHEDRINE SULFATE 50 MG/ML IJ SOLN
INTRAMUSCULAR | Status: DC | PRN
  Administered 2015-05-29 (×2): 10 mg via INTRAVENOUS

## 2015-05-29 MED ORDER — KETOROLAC TROMETHAMINE 30 MG/ML IJ SOLN
INTRAMUSCULAR | Status: DC | PRN
  Administered 2015-05-29: 30 mg via INTRAVENOUS

## 2015-05-29 MED ORDER — METFORMIN HCL 500 MG PO TABS
1000.0000 mg | ORAL_TABLET | Freq: Two times a day (BID) | ORAL | Status: DC
  Administered 2015-05-29 – 2015-05-30 (×2): 1000 mg via ORAL
  Filled 2015-05-29 (×4): qty 2

## 2015-05-29 MED ORDER — HEPARIN SODIUM (PORCINE) 5000 UNIT/ML IJ SOLN
5000.0000 [IU] | Freq: Once | INTRAMUSCULAR | Status: AC
  Administered 2015-05-29: 5000 [IU] via SUBCUTANEOUS

## 2015-05-29 MED ORDER — SIMETHICONE 80 MG PO CHEW: 160.0000 mg | CHEWABLE_TABLET | Freq: Four times a day (QID) | ORAL | Status: DC | PRN

## 2015-05-29 MED ORDER — ROCURONIUM BROMIDE 100 MG/10ML IV SOLN
INTRAVENOUS | Status: DC | PRN
  Administered 2015-05-29: 10 mg via INTRAVENOUS
  Administered 2015-05-29: 20 mg via INTRAVENOUS
  Administered 2015-05-29: 10 mg via INTRAVENOUS
  Administered 2015-05-29: 40 mg via INTRAVENOUS
  Administered 2015-05-29: 20 mg via INTRAVENOUS

## 2015-05-29 MED ORDER — LIDOCAINE HCL (CARDIAC) 20 MG/ML IV SOLN
INTRAVENOUS | Status: DC | PRN
  Administered 2015-05-29: 40 mg via INTRAVENOUS

## 2015-05-29 MED ORDER — DOCUSATE SODIUM 100 MG PO CAPS
100.0000 mg | ORAL_CAPSULE | Freq: Two times a day (BID) | ORAL | Status: DC
  Administered 2015-05-29 – 2015-05-30 (×2): 100 mg via ORAL
  Filled 2015-05-29 (×2): qty 1

## 2015-05-29 MED ORDER — HEPARIN SODIUM (PORCINE) 5000 UNIT/ML IJ SOLN
INTRAMUSCULAR | Status: AC
  Filled 2015-05-29: qty 1

## 2015-05-29 MED ORDER — TRAMADOL HCL 50 MG PO TABS: 50.0000 mg | ORAL_TABLET | Freq: Four times a day (QID) | ORAL | Status: DC | PRN

## 2015-05-29 MED ORDER — GLIPIZIDE ER 5 MG PO TB24
5.0000 mg | ORAL_TABLET | Freq: Every day | ORAL | Status: DC
  Administered 2015-05-30: 5 mg via ORAL
  Filled 2015-05-29 (×2): qty 1

## 2015-05-29 MED ORDER — CEFAZOLIN SODIUM-DEXTROSE 2-3 GM-% IV SOLR: 2.0000 g | Freq: Once | INTRAVENOUS | Status: DC

## 2015-05-29 MED ORDER — IBUPROFEN 600 MG PO TABS
600.0000 mg | ORAL_TABLET | Freq: Four times a day (QID) | ORAL | Status: DC
  Administered 2015-05-29 – 2015-05-30 (×3): 600 mg via ORAL
  Filled 2015-05-29 (×3): qty 1

## 2015-05-29 MED ORDER — TRAMADOL HCL 50 MG PO TABS
50.0000 mg | ORAL_TABLET | Freq: Four times a day (QID) | ORAL | Status: DC | PRN
  Administered 2015-05-29: 50 mg via ORAL
  Filled 2015-05-29: qty 1

## 2015-05-29 MED ORDER — ONDANSETRON HCL 4 MG/2ML IJ SOLN
INTRAMUSCULAR | Status: AC
  Filled 2015-05-29: qty 2

## 2015-05-29 MED ORDER — FENTANYL CITRATE (PF) 100 MCG/2ML IJ SOLN
INTRAMUSCULAR | Status: DC | PRN
  Administered 2015-05-29: 50 ug via INTRAVENOUS
  Administered 2015-05-29: 100 ug via INTRAVENOUS
  Administered 2015-05-29: 150 ug via INTRAVENOUS
  Administered 2015-05-29: 100 ug via INTRAVENOUS
  Administered 2015-05-29 (×3): 50 ug via INTRAVENOUS

## 2015-05-29 MED ORDER — GLYCOPYRROLATE 0.2 MG/ML IJ SOLN
INTRAMUSCULAR | Status: DC | PRN
  Administered 2015-05-29: 0.6 mg via INTRAVENOUS

## 2015-05-29 MED ORDER — PROPOFOL 10 MG/ML IV BOLUS
INTRAVENOUS | Status: DC | PRN
  Administered 2015-05-29 (×2): 20 mg via INTRAVENOUS
  Administered 2015-05-29: 150 mg via INTRAVENOUS

## 2015-05-29 MED ORDER — SODIUM CHLORIDE 0.9 % IJ SOLN
INTRAMUSCULAR | Status: AC
  Filled 2015-05-29: qty 10

## 2015-05-29 MED ORDER — ONDANSETRON HCL 4 MG PO TABS: 4.0000 mg | ORAL_TABLET | Freq: Four times a day (QID) | ORAL | Status: DC | PRN

## 2015-05-29 MED ORDER — IBUPROFEN 600 MG PO TABS: 600.0000 mg | ORAL_TABLET | Freq: Four times a day (QID) | ORAL | Status: DC

## 2015-05-29 MED ORDER — INSULIN ASPART 100 UNIT/ML ~~LOC~~ SOLN
0.0000 [IU] | Freq: Three times a day (TID) | SUBCUTANEOUS | Status: DC
  Administered 2015-05-29: 3 [IU] via SUBCUTANEOUS
  Administered 2015-05-30 (×2): 2 [IU] via SUBCUTANEOUS
  Filled 2015-05-29: qty 2
  Filled 2015-05-29: qty 3
  Filled 2015-05-29: qty 2

## 2015-05-29 MED ORDER — ACETAMINOPHEN 325 MG PO TABS
650.0000 mg | ORAL_TABLET | ORAL | Status: DC
  Administered 2015-05-29 – 2015-05-30 (×2): 650 mg via ORAL
  Filled 2015-05-29 (×2): qty 2

## 2015-05-29 MED ORDER — INSULIN ASPART 100 UNIT/ML ~~LOC~~ SOLN: 0.0000 [IU] | SUBCUTANEOUS | Status: DC

## 2015-05-29 MED ORDER — CEFAZOLIN SODIUM-DEXTROSE 2-3 GM-% IV SOLR
INTRAVENOUS | Status: AC
  Administered 2015-05-29: 2 g via INTRAVENOUS
  Filled 2015-05-29: qty 50

## 2015-05-29 MED ORDER — FAMOTIDINE 20 MG PO TABS
20.0000 mg | ORAL_TABLET | Freq: Once | ORAL | Status: AC
  Administered 2015-05-29: 20 mg via ORAL

## 2015-05-29 MED ORDER — PROMETHAZINE HCL 25 MG/ML IJ SOLN
INTRAMUSCULAR | Status: AC
  Administered 2015-05-29: 6.25 mg via INTRAVENOUS
  Filled 2015-05-29: qty 1

## 2015-05-29 MED ORDER — ONDANSETRON HCL 4 MG/2ML IJ SOLN
INTRAMUSCULAR | Status: DC | PRN
  Administered 2015-05-29: 4 mg via INTRAVENOUS

## 2015-05-29 MED ORDER — BUPIVACAINE HCL 0.5 % IJ SOLN
INTRAMUSCULAR | Status: DC | PRN
  Administered 2015-05-29: 10 mL

## 2015-05-29 MED ORDER — KETOROLAC TROMETHAMINE 30 MG/ML IJ SOLN: 30.0000 mg | Freq: Once | INTRAMUSCULAR | Status: DC

## 2015-05-29 MED ORDER — PROMETHAZINE HCL 25 MG/ML IJ SOLN
6.2500 mg | Freq: Once | INTRAMUSCULAR | Status: AC
  Administered 2015-05-29: 6.25 mg via INTRAVENOUS

## 2015-05-29 MED ORDER — MIDAZOLAM HCL 2 MG/2ML IJ SOLN
INTRAMUSCULAR | Status: DC | PRN
  Administered 2015-05-29: 2 mg via INTRAVENOUS

## 2015-05-29 MED ORDER — INSULIN ASPART 100 UNIT/ML ~~LOC~~ SOLN
0.0000 [IU] | SUBCUTANEOUS | Status: DC
Start: 2015-05-29 — End: 2015-05-29
  Administered 2015-05-29: 3 [IU] via SUBCUTANEOUS
  Filled 2015-05-29: qty 3

## 2015-05-29 MED ORDER — SODIUM CHLORIDE 0.9 % IV SOLN
INTRAVENOUS | Status: DC
  Administered 2015-05-29 (×2): via INTRAVENOUS

## 2015-05-29 MED ORDER — DEXAMETHASONE SODIUM PHOSPHATE 4 MG/ML IJ SOLN
INTRAMUSCULAR | Status: DC | PRN
  Administered 2015-05-29: 4 mg via INTRAVENOUS

## 2015-05-29 MED ORDER — ONDANSETRON HCL 4 MG/2ML IJ SOLN
4.0000 mg | Freq: Once | INTRAMUSCULAR | Status: AC | PRN
  Administered 2015-05-29: 4 mg via INTRAVENOUS

## 2015-05-29 MED ORDER — BUPIVACAINE HCL (PF) 0.5 % IJ SOLN
INTRAMUSCULAR | Status: AC
  Filled 2015-05-29: qty 30

## 2015-05-29 MED ORDER — ONDANSETRON HCL 4 MG/2ML IJ SOLN
4.0000 mg | Freq: Four times a day (QID) | INTRAMUSCULAR | Status: DC | PRN
Start: 2015-05-29 — End: 2015-05-30

## 2015-05-29 MED ORDER — AMMONIA AROMATIC IN INHA
RESPIRATORY_TRACT | Status: AC
  Filled 2015-05-29: qty 10

## 2015-05-29 MED ORDER — FENTANYL CITRATE (PF) 100 MCG/2ML IJ SOLN: 25.0000 ug | INTRAMUSCULAR | Status: DC | PRN

## 2015-05-29 SURGICAL SUPPLY — 53 items
BAG URO DRAIN 2000ML W/SPOUT (MISCELLANEOUS) ×3 IMPLANT
BLADE SURG SZ11 CARB STEEL (BLADE) ×3 IMPLANT
CANISTER SUCT 1200ML W/VALVE (MISCELLANEOUS) ×3 IMPLANT
CATH FOLEY 2WAY  5CC 16FR (CATHETERS) ×2
CATH URTH 16FR FL 2W BLN LF (CATHETERS) ×1 IMPLANT
CHLORAPREP W/TINT 26ML (MISCELLANEOUS) ×3 IMPLANT
DEFOGGER SCOPE WARMER CLEARIFY (MISCELLANEOUS) ×3 IMPLANT
DRAPE LEGGINS SURG 28X43 STRL (DRAPES) ×3 IMPLANT
DRAPE UNDER BUTTOCK W/FLU (DRAPES) ×3 IMPLANT
DRSG TEGADERM 2-3/8X2-3/4 SM (GAUZE/BANDAGES/DRESSINGS) ×9 IMPLANT
ENDOSTITCH 0 SINGLE 48 (SUTURE) ×9 IMPLANT
GAUZE SPONGE NON-WVN 2X2 STRL (MISCELLANEOUS) ×1 IMPLANT
GLOVE SURG SYN 6.5 ES PF (GLOVE) ×24 IMPLANT
GOWN STRL REUS W/ TWL LRG LVL3 (GOWN DISPOSABLE) ×3 IMPLANT
GOWN STRL REUS W/ TWL XL LVL3 (GOWN DISPOSABLE) ×1 IMPLANT
GOWN STRL REUS W/TWL LRG LVL3 (GOWN DISPOSABLE) ×6
GOWN STRL REUS W/TWL XL LVL3 (GOWN DISPOSABLE) ×2
HIBICLENS CHG 4% 32OZ (MISCELLANEOUS) ×3 IMPLANT
IRRIGATION STRYKERFLOW (MISCELLANEOUS) ×1 IMPLANT
IRRIGATOR STRYKERFLOW (MISCELLANEOUS) ×3
IV LACTATED RINGERS 1000ML (IV SOLUTION) ×3 IMPLANT
KIT PINK PAD W/HEAD ARE REST (MISCELLANEOUS) ×3
KIT PINK PAD W/HEAD ARM REST (MISCELLANEOUS) ×1 IMPLANT
KIT RM TURNOVER CYSTO AR (KITS) ×3 IMPLANT
LABEL OR SOLS (LABEL) ×3 IMPLANT
LIGASURE BLUNT 5MM 37CM (INSTRUMENTS) ×3 IMPLANT
LIQUID BAND (GAUZE/BANDAGES/DRESSINGS) ×3 IMPLANT
MANIPULATOR VCARE LG CRV RETR (MISCELLANEOUS) IMPLANT
MANIPULATOR VCARE STD CRV RETR (MISCELLANEOUS) ×3 IMPLANT
NDL SAFETY 22GX1.5 (NEEDLE) ×3 IMPLANT
NEEDLE VERESS 14GA 120MM (NEEDLE) ×3 IMPLANT
NS IRRIG 500ML POUR BTL (IV SOLUTION) ×3 IMPLANT
PACK LAP CHOLECYSTECTOMY (MISCELLANEOUS) ×3 IMPLANT
PAD OB MATERNITY 4.3X12.25 (PERSONAL CARE ITEMS) ×3 IMPLANT
PAD PREP 24X41 OB/GYN DISP (PERSONAL CARE ITEMS) ×3 IMPLANT
SCISSORS METZENBAUM CVD 33 (INSTRUMENTS) IMPLANT
SET CYSTO W/LG BORE CLAMP LF (SET/KITS/TRAYS/PACK) ×3 IMPLANT
SLEEVE ENDOPATH XCEL 5M (ENDOMECHANICALS) ×6 IMPLANT
SPONGE LAP 18X18 5 PK (GAUZE/BANDAGES/DRESSINGS) IMPLANT
SPONGE VERSALON 2X2 STRL (MISCELLANEOUS) ×2
SURGILUBE 2OZ TUBE FLIPTOP (MISCELLANEOUS) ×3 IMPLANT
SUT ENDO VLOC 180-0-8IN (SUTURE) ×3 IMPLANT
SUT MNCRL 4-0 (SUTURE) ×2
SUT MNCRL 4-0 27XMFL (SUTURE) ×1
SUT VIC AB 0 CT1 36 (SUTURE) ×12 IMPLANT
SUT VIC AB 0 CT2 27 (SUTURE) ×3 IMPLANT
SUT VIC AB 2-0 UR6 27 (SUTURE) ×6 IMPLANT
SUTURE MNCRL 4-0 27XMF (SUTURE) ×1 IMPLANT
SYR 50ML LL SCALE MARK (SYRINGE) ×3 IMPLANT
SYRINGE 10CC LL (SYRINGE) ×6 IMPLANT
TROCAR BLUNT TIP 12MM OMST12BT (TROCAR) ×3 IMPLANT
TROCAR XCEL NON-BLD 5MMX100MML (ENDOMECHANICALS) ×3 IMPLANT
TUBING INSUFFLATOR HEATED (MISCELLANEOUS) ×3 IMPLANT

## 2015-05-29 NOTE — Discharge Instructions (Signed)
Discharge instructions:   Call office if you have any of the following: headache, visual changes, fever >101.0 F, chills, excessive vaginal bleeding, incision drainage or problems, leg pain or redness, or any other concerns.   Activity: Do not lift > 10 lbs for 6 weeks.  No intercourse or tampons for 6 weeks. No tub baths-showers only. No driving for 1-2 weeks, until you feel certain you could slam on the brakes. No driving while on narcotics.  You should expect some pain while in recovery.  The medications will meet you halfway, but please don't think you will be 100% pain-free.  The activity you do in your post op period should help your recovery.  Please don't be a hero, but don't be a wimp either!  You will have gas pain in your right rib and shoulder.  This will resolve in a few days.  If you have unresolving pain, nausea/vomiting, or heavy vaginal bleeding please let Dr. Leonides Schanz know ASAP.

## 2015-05-29 NOTE — Progress Notes (Signed)
Brief post op check  S: doing well, has ambulated to bathroom and back, denies pain, nausea, chest pain, SOB, LE pain, fever/chills.  Sitting up in bed with family and friends in room.  BP 149/88 mmHg  Pulse 82  Temp(Src) 97.2 F (36.2 C) (Tympanic)  Resp 23  SpO2 94%   GEN: NAD ABD: nttp LEs: nttp, SCDs on and in place.  A/P: 34yo s/p TLH BS, POD 0  1. Likely discharge this evening 2. Continue post op cares 3. Continue sliding scale insulin while inpatient, resume normal glycemic control once discharged.  ----- Larey Days, MD Attending Obstetrician and Gynecologist [facility]

## 2015-05-29 NOTE — Anesthesia Preprocedure Evaluation (Signed)
Anesthesia Evaluation  Patient identified by MRN, date of birth, ID band Patient awake    Reviewed: Allergy & Precautions, NPO status , Patient's Chart, lab work & pertinent test results  Airway Mallampati: III  TM Distance: <3 FB     Dental  (+) Chipped   Pulmonary Current Smoker,    Pulmonary exam normal breath sounds clear to auscultation       Cardiovascular hypertension, Normal cardiovascular exam     Neuro/Psych negative neurological ROS  negative psych ROS   GI/Hepatic negative GI ROS, Neg liver ROS,   Endo/Other  diabetes, Well Controlled, Type 2, Insulin Dependent, Oral Hypoglycemic Agents  Renal/GU negative Renal ROS  negative genitourinary   Musculoskeletal negative musculoskeletal ROS (+)   Abdominal Normal abdominal exam  (+)   Peds negative pediatric ROS (+)  Hematology negative hematology ROS (+)   Anesthesia Other Findings   Reproductive/Obstetrics                             Anesthesia Physical Anesthesia Plan  ASA: II  Anesthesia Plan: General   Post-op Pain Management:    Induction: Intravenous  Airway Management Planned: Oral ETT  Additional Equipment:   Intra-op Plan:   Post-operative Plan: Extubation in OR  Informed Consent: I have reviewed the patients History and Physical, chart, labs and discussed the procedure including the risks, benefits and alternatives for the proposed anesthesia with the patient or authorized representative who has indicated his/her understanding and acceptance.   Dental advisory given  Plan Discussed with: CRNA and Surgeon  Anesthesia Plan Comments:         Anesthesia Quick Evaluation

## 2015-05-29 NOTE — Anesthesia Procedure Notes (Signed)
Procedure Name: Intubation Date/Time: 05/29/2015 10:30 AM Performed by: Lorie Apley Pre-anesthesia Checklist: Patient identified, Suction available and Emergency Drugs available Patient Re-evaluated:Patient Re-evaluated prior to inductionOxygen Delivery Method: Circle system utilized Preoxygenation: Pre-oxygenation with 100% oxygen Intubation Type: IV induction Ventilation: Mask ventilation without difficulty Laryngoscope Size: Mac and 3 Grade View: Grade I Tube type: Oral Tube size: 7.0 mm Number of attempts: 1 Airway Equipment and Method: Stylet Placement Confirmation: ETT inserted through vocal cords under direct vision,  positive ETCO2,  CO2 detector and breath sounds checked- equal and bilateral Tube secured with: Tape Dental Injury: Teeth and Oropharynx as per pre-operative assessment

## 2015-05-29 NOTE — Transfer of Care (Signed)
Immediate Anesthesia Transfer of Care Note  Patient: Ariana Lindsey  Procedure(s) Performed: Procedure(s): HYSTERECTOMY TOTAL LAPAROSCOPIC/BILATERAL SALPINGECTOMY (N/A)  Patient Location: PACU  Anesthesia Type:General  Level of Consciousness: awake, oriented and patient cooperative  Airway & Oxygen Therapy: Patient Spontanous Breathing and Patient connected to face mask oxygen  Post-op Assessment: Report given to RN, Post -op Vital signs reviewed and stable and Patient moving all extremities  Post vital signs: Reviewed and stable  Last Vitals:  Filed Vitals:   05/29/15 1328  BP: 131/83  Pulse: 75  Temp: 36.2 C  Resp: 20    Complications: No apparent anesthesia complications

## 2015-05-29 NOTE — Discharge Summary (Signed)
Gynecology Physician Postoperative Discharge Summary  Patient ID: Ariana Lindsey MRN: [id number] DOB/AGE: 36/07/1981 36 y.o.  Admit Date: 05/29/2015 Discharge Date: 05/30/2015  Preoperative Diagnoses: Fibroid uterus and chronic pelvic pain Postoperative Diagnoses: Same, s/p TLH BS  Procedures: Procedure(s) (LRB): HYSTERECTOMY TOTAL LAPAROSCOPIC/BILATERAL SALPINGECTOMY (N/A)  CBC Latest Ref Rng 05/26/2015 04/14/2015 04/20/2013  WBC 3.6 - 11.0 K/uL 11.6(H) 9.4 4.5  Hemoglobin 12.0 - 16.0 g/dL 12.6 15.0 12.3  Hematocrit 35.0 - 47.0 % 37.5 45.0 35.6  Platelets 150 - 440 K/uL 297 278 187    Hospital Course:  Ariana Lindsey is a 34 y.o. admitted for scheduled surgery.  She underwent the procedures as mentioned above, her operation was uncomplicated. For further details about surgery, please refer to the operative report. Patient had an uncomplicated postoperative course. By time of discharge, her pain was controlled on oral pain medications; she was ambulating, voiding without difficulty, tolerating regular diet and passing flatus. She was deemed stable for discharge to home.   Discharge Exam: Blood pressure 138/98, pulse 98, temperature 98.2 F (36.8 C), temperature source Oral, resp. rate 20, last menstrual period 05/10/2015, SpO2 99 %. General appearance: alert and no distress  Resp: clear to auscultation bilaterally, normal respiratory effort Cardio: regular rate and rhythm  GI: soft, non-tender; bowel sounds normal; no masses, no organomegaly.  Incisions: C/D/I, no erythema, no drainage noted Pelvic: scant blood on pad  Extremities: extremities normal, atraumatic, no cyanosis or edema and Homans sign is negative, no sign of DVT  Discharged Condition: Stable     Discharge Instructions    Diet - low sodium heart healthy    Complete by:  As directed      Increase activity slowly    Complete by:  As directed             Medication List    TAKE these medications         acetaminophen 325 MG tablet  Commonly known as:  TYLENOL  Take 2 tablets (650 mg total) by mouth every 4 (four) hours.     glipiZIDE 5 MG tablet  Commonly known as:  GLUCOTROL  Take 5 mg by mouth daily before breakfast.     ibuprofen 600 MG tablet  Commonly known as:  ADVIL,MOTRIN  Take 1 tablet (600 mg total) by mouth every 6 (six) hours.     insulin glargine 100 UNIT/ML injection  Commonly known as:  LANTUS  Inject 60 Units into the skin daily.     metFORMIN 1000 MG tablet  Commonly known as:  GLUCOPHAGE  Take 1,000 mg by mouth 2 (two) times daily with a meal.     ondansetron 4 MG tablet  Commonly known as:  ZOFRAN  Take 1 tablet (4 mg total) by mouth every 6 (six) hours as needed for nausea.     traMADol 50 MG tablet  Commonly known as:  ULTRAM  Take 1 tablet (50 mg total) by mouth every 6 (six) hours as needed for moderate pain.       Follow-up Information    Follow up with Ward, Honor Loh, MD In 2 weeks.   Specialty:  Obstetrics and Gynecology   Why:  for initial post operative visit   Contact information:   [address] [place] [address] [phone]       Follow up with Ward, Honor Loh, MD In 6 weeks.   Specialty:  Obstetrics and Gynecology   Why:  for comprehensive post operative visit  Contact information:   [place] [address] [phone]       Signed:  Ward, Honor Loh Attending Obstetrician & Gynecologist [facility]

## 2015-05-29 NOTE — Anesthesia Postprocedure Evaluation (Signed)
  Anesthesia Post-op Note  Patient: Ariana Lindsey  Procedure(s) Performed: Procedure(s): HYSTERECTOMY TOTAL LAPAROSCOPIC/BILATERAL SALPINGECTOMY (N/A)  Anesthesia type:General  Patient location: PACU  Post pain: Pain level controlled  Post assessment: Post-op Vital signs reviewed, Patient's Cardiovascular Status Stable, Respiratory Function Stable, Patent Airway and No signs of Nausea or vomiting  Post vital signs: Reviewed and stable  Last Vitals:  Filed Vitals:   05/29/15 1414  BP: 148/87  Pulse: 68  Temp:   Resp: 24    Level of consciousness: awake, alert  and patient cooperative  Complications: No apparent anesthesia complications

## 2015-05-29 NOTE — Op Note (Signed)
Total Laparoscopic Hysterectomy Operative Note Procedure Date: 05/29/2015  Patient:  Ariana Lindsey  34 y.o. female  PRE-OPERATIVE DIAGNOSIS:  CHRONIC PELVIC PAIN,FIBROID UTERUS  POST-OPERATIVE DIAGNOSIS:  chronic pelvic pain, fibroid uterus  PROCEDURE:  Procedure(s): HYSTERECTOMY TOTAL LAPAROSCOPIC/BILATERAL SALPINGECTOMY (N/A)  SURGEON:  Surgeon(s) and Role:    * Shizuye Rupert Loletha Grayer Corie Vavra, MD - Primary    * Gae Dry, MD - Assisting  ANESTHESIA:  General via ET  I/O  Total I/O In: 1200 [I.V.:1200] Out: 1010 [Urine:1000; Blood:10]  FINDINGS:  Small uterus with left anterior fibroid, normal ovaries and fallopian tubes bilaterally.  Normal upper abdomen. Omental adhesion to anterior abdominal wall at site of BTL incision.  SPECIMEN: Uterus, Cervix, and bilateral fallopian tubes  COMPLICATIONS: none apparent  DISPOSITION: vital signs stable to PACU  Indication for Surgery: 34 y.o. Presented as an outpatient with complaints of significant and debilitating pain with menses, she is s/p multiple treatment modalities and requested hysterectomy for management of this pain.  Risks of surgery were discussed with the patient including but not limited to: bleeding which may require transfusion or reoperation; infection which may require antibiotics; injury to bowel, bladder, ureters or other surrounding organs; need for additional procedures including laparotomy, blood clot, incisional problems and other postoperative/anesthesia complications. Written informed consent was obtained.      PROCEDURE IN DETAIL:  The patient had 5000u Heparin Sub-q and sequential compression devices applied to her lower extremities while in the preoperative area.  She was then taken to the operating room where general anesthesia was administered and was found to be adequate.  She was placed in the dorsal lithotomy position, and was prepped and draped in a sterile manner. A surgical time-out was performed.  A Foley  catheter was inserted into her bladder and attached to constant drainage and a V-Care uterine manipulator was then advanced into the uterus and a good fit around the cervix was noted. The gloves were changed, and attention was turned to the abdomen where a supraumbilical incision was made with the scalpel. An open Sheryle Hail technique was used to enter the abodmen, and a 45mm balloon trochar was inserted. The abdomen was insufflated to 15mg Hg carbon dioxide gas and adequate pneumoperitoneum was obtained. A survey of the patient's pelvis and abdomen revealed the findings as mentioned above. Two 77mm ports were inserted in the lower left and right quadrants under visualization.    The bilateral fallopian tubes were separated from the mesosalpinx using the Ligasure. The bilateral round ligaments were transected and anterior broad ligament divided and brought across the uterus to separate the vesicouterine peritoneum and create a bladder flap. The bladder was pushed away from the uterus. The bilateral uterine arteries were ligated and transected. The bilateral uterosacral and cardinal ligaments were ligated and transected. A colpotomy was made around the V-Care cervical cup and the uterus, cervix, and bilateral tubes were removed through the vagina. The vaginal cuff was closed using the endostitch with a running and intermittently locking V-lock suture. This was tested for integrity using the surgeon's finger. After a change of gloves, the pneumoperitoneum was recreated and surgical site inspected, and found to be hemostatic. Bilateral ureters were visualized vermiuclating. No intraoperative injury to surrounding organs was noted. The abdomen was desufflated and all instruments were then removed.   The fascia of the 32mm incision was closed with 2-0 vicryl. All skin incisions were closed with 4-0 monocryl and covered with surgical glue. The patient tolerated the procedures well.  All instruments, needles,  and  sponge counts were correct x 2. The patient was taken to the recovery room in stable condition.   ---- Larey Days, MD Attending Obstetrician and Gynecologist [facility]

## 2015-05-29 NOTE — Progress Notes (Signed)
Pt noted to be positive for Staph Aureus nasally on admission. Staph Aureus PCR positive nursing order set reviewed with MD who did not feel it necessary to initiate due to patient being observation status.

## 2015-05-30 ENCOUNTER — Encounter: Payer: Self-pay | Admitting: Obstetrics & Gynecology

## 2015-05-30 DIAGNOSIS — N72 Inflammatory disease of cervix uteri: Secondary | ICD-10-CM | POA: Diagnosis not present

## 2015-05-30 LAB — GLUCOSE, CAPILLARY
Glucose-Capillary: 168 mg/dL — ABNORMAL HIGH (ref 65–99)
Glucose-Capillary: 155 mg/dL — ABNORMAL HIGH (ref 65–99)

## 2015-05-30 LAB — SURGICAL PATHOLOGY

## 2015-05-30 NOTE — Progress Notes (Addendum)
Patient discharged home. Vital signs stable, bleeding within normal limits, inc WDL. Discharge instructions, prescriptions, and follow up appointment given to and reviewed with patient. Patient verbalized understanding, all questions answered. Patient declined wheelchair, ambulated to car.

## 2015-05-30 NOTE — Progress Notes (Addendum)
Inpatient Diabetes Program Recommendations  AACE/ADA: New Consensus Statement on Inpatient Glycemic Control (2015)  Target Ranges:  Prepandial:   less than 140 mg/dL      Peak postprandial:   less than 180 mg/dL (1-2 hours)      Critically ill patients:  140 - 180 mg/dL   Review of Glycemic Control  Results for Ariana Lindsey, Ariana Lindsey (MRN [id number]) as of 05/30/2015 10:44  Ref. Range 04/14/2015 11:13 04/14/2015 11:14 05/26/2015 12:49 05/26/2015 13:00 05/29/2015 08:55  Hemoglobin A1C Latest Ref Range: 4.0-6.0 % 11.5 (H)   8.8 (H)    Results for Ariana Lindsey, Ariana Lindsey (MRN [id number]) as of 05/30/2015 10:44  Ref. Range 05/29/2015 08:54 05/29/2015 13:28 05/29/2015 16:44 05/29/2015 19:24 05/30/2015 07:53  Glucose-Capillary Latest Ref Range: 65-99 mg/dL 192 (H) 238 (H) 218 (H) 217 (H) 168 (H)    Diabetes history: Type 2 Outpatient Diabetes medications:Lantus 60 units q day , Metformin 1000mg  bid, Glucotrol 5 mg with breakfast Current orders for Inpatient glycemic control: Lantus 60 units q day , Metformin 1000mg  bid, Glucotrol 5 mg with breakfast, Novolog 0-9 units tid with meals  Inpatient Diabetes Program Recommendations: Post prandial blood sugars elevated even with Novolog at current dose- likely a result of intra-op steroids.   If blood sugars remain elevated,  consider increasing Novolog correction to moderate correction scale 0-15 units tid with meals.   Encourage patient to follow up with Dr. Marchelle Folks to continue to improve A1C.   Gentry Fitz, RN, BA, MHA, CDE Diabetes Coordinator Inpatient Diabetes Program  [phone] (Team Pager) [phone] ([place]) 05/30/2015 10:51 AM

## 2017-04-23 ENCOUNTER — Emergency Department
Admission: EM | Admit: 2017-04-23 | Discharge: 2017-04-24 | Disposition: A | Discharge status: Home / Self Care | Payer: 59 | Attending: Emergency Medicine | Admitting: Emergency Medicine

## 2017-04-23 ENCOUNTER — Encounter: Payer: Self-pay | Admitting: Emergency Medicine

## 2017-04-23 DIAGNOSIS — Z79899 Other long term (current) drug therapy: Secondary | ICD-10-CM | POA: Insufficient documentation

## 2017-04-23 DIAGNOSIS — R110 Nausea: Secondary | ICD-10-CM | POA: Insufficient documentation

## 2017-04-23 DIAGNOSIS — Z794 Long term (current) use of insulin: Secondary | ICD-10-CM | POA: Diagnosis not present

## 2017-04-23 DIAGNOSIS — R1011 Right upper quadrant pain: Secondary | ICD-10-CM

## 2017-04-23 DIAGNOSIS — F17210 Nicotine dependence, cigarettes, uncomplicated: Secondary | ICD-10-CM | POA: Insufficient documentation

## 2017-04-23 DIAGNOSIS — I10 Essential (primary) hypertension: Secondary | ICD-10-CM | POA: Insufficient documentation

## 2017-04-23 DIAGNOSIS — E119 Type 2 diabetes mellitus without complications: Secondary | ICD-10-CM | POA: Diagnosis not present

## 2017-04-23 LAB — COMPREHENSIVE METABOLIC PANEL
ALT: 28 U/L (ref 14–54)
AST: 20 U/L (ref 15–41)
Albumin: 4.5 g/dL (ref 3.5–5.0)
Alkaline Phosphatase: 65 U/L (ref 38–126)
Anion gap: 10 (ref 5–15)
BUN: 16 mg/dL (ref 6–20)
CO2: 25 mmol/L (ref 22–32)
Calcium: 9.4 mg/dL (ref 8.9–10.3)
Chloride: 101 mmol/L (ref 101–111)
Creatinine, Ser: 0.93 mg/dL (ref 0.44–1.00)
GFR calc Af Amer: >60 mL/min (ref >60)
GFR calc non Af Amer: >60 mL/min (ref >60)
Glucose, Bld: 403 mg/dL — ABNORMAL HIGH (ref 65–99)
Potassium: 3.9 mmol/L (ref 3.5–5.1)
Sodium: 136 mmol/L (ref 135–145)
Total Bilirubin: 0.3 mg/dL (ref 0.3–1.2)
Total Protein: 7.5 g/dL (ref 6.5–8.1)

## 2017-04-23 LAB — URINALYSIS, COMPLETE (UACMP) WITH MICROSCOPIC
Bilirubin Urine: NEGATIVE
Glucose, UA: >=500 mg/dL — AB
Hgb urine dipstick: NEGATIVE
Ketones, ur: NEGATIVE mg/dL
Leukocytes, UA: NEGATIVE
Nitrite: NEGATIVE
Protein, ur: NEGATIVE mg/dL
Specific Gravity, Urine: 1.025 (ref 1.005–1.030)
pH: 5 (ref 5.0–8.0)

## 2017-04-23 LAB — CBC
HCT: 41.1 % (ref 35.0–47.0)
Hemoglobin: 14.1 g/dL (ref 12.0–16.0)
MCH: 31.3 pg (ref 26.0–34.0)
MCHC: 34.4 g/dL (ref 32.0–36.0)
MCV: 90.9 fL (ref 80.0–100.0)
Platelets: 295 10*3/uL (ref 150–440)
RBC: 4.52 MIL/uL (ref 3.80–5.20)
RDW: 13.8 % (ref 11.5–14.5)
WBC: 12.4 10*3/uL — ABNORMAL HIGH (ref 3.6–11.0)

## 2017-04-23 LAB — LIPASE, BLOOD: Lipase: 20 U/L (ref 11–51)

## 2017-04-23 MED ORDER — ONDANSETRON HCL 4 MG/2ML IJ SOLN
4.0000 mg | Freq: Once | INTRAMUSCULAR | Status: AC
  Administered 2017-04-24: 4 mg via INTRAVENOUS
  Filled 2017-04-23: qty 2

## 2017-04-23 MED ORDER — SODIUM CHLORIDE 0.9 % IV BOLUS (SEPSIS)
1000.0000 mL | Freq: Once | INTRAVENOUS | Status: AC
  Administered 2017-04-24: 1000 mL via INTRAVENOUS

## 2017-04-23 MED ORDER — MORPHINE SULFATE (PF) 2 MG/ML IV SOLN
2.0000 mg | Freq: Once | INTRAVENOUS | Status: AC
  Administered 2017-04-24: 2 mg via INTRAVENOUS
  Filled 2017-04-23: qty 1

## 2017-04-23 NOTE — ED Triage Notes (Signed)
Patient with complaint of generalized abdominal pain but hurts the most in the right upper quadrant times one week. Patient states that she also has nausea and has vomited times one. Patient states that the pain became worse today.

## 2017-04-23 NOTE — ED Provider Notes (Signed)
[facility] Emergency Department Provider Note    First MD Initiated Contact with Patient 04/23/17 2344     (approximate)  I have reviewed the triage vital signs and the nursing notes.   HISTORY  Chief Complaint Abdominal Pain and Nausea    HPI Ariana Lindsey is a 36 y.o. female with below list of chronic medical conditions presents with one-week history of intermittent upper abdominal discomfort accompanied by nausea. Patient states her current pain score is 8 out of 10. Patient denies any vomiting or diarrhea. Patient denies any constipation. Patient denies any urinary symptoms.  Past Medical History:  Diagnosis Date  . Diabetes mellitus without complication ([place])   . Hypertension     Patient Active Problem List   Diagnosis Date Noted  . S/P total hysterectomy 05/29/2015    Past Surgical History:  Procedure Laterality Date  . CESAREAN SECTION     x 2  . LAPAROSCOPIC HYSTERECTOMY N/A 05/29/2015   Procedure: HYSTERECTOMY TOTAL LAPAROSCOPIC/BILATERAL SALPINGECTOMY;  Surgeon: Honor Loh Ward, MD;  Location: [facility] ORS;  Service: Gynecology;  Laterality: N/A;  . TUBAL LIGATION      Prior to Admission medications   Medication Sig Start Date End Date Taking? Authorizing Provider  acetaminophen (TYLENOL) 325 MG tablet Take 2 tablets (650 mg total) by mouth every 4 (four) hours. 05/29/15   Ward, Honor Loh, MD  glipiZIDE (GLUCOTROL) 5 MG tablet Take 5 mg by mouth daily before breakfast.    [provider]  ibuprofen (ADVIL,MOTRIN) 600 MG tablet Take 1 tablet (600 mg total) by mouth every 6 (six) hours. 05/29/15   Ward, Honor Loh, MD  insulin glargine (LANTUS) 100 UNIT/ML injection Inject 60 Units into the skin daily.    [provider]  metFORMIN (GLUCOPHAGE) 1000 MG tablet Take 1,000 mg by mouth 2 (two) times daily with a meal.    [provider]  ondansetron (ZOFRAN) 4 MG tablet Take 1 tablet (4 mg total) by mouth every 6  (six) hours as needed for nausea. 05/29/15   Ward, Honor Loh, MD  pantoprazole (PROTONIX) 40 MG tablet Take 1 tablet (40 mg total) by mouth daily. 04/24/17 04/24/18  Gregor Hams, MD  traMADol (ULTRAM) 50 MG tablet Take 1 tablet (50 mg total) by mouth every 6 (six) hours as needed for moderate pain. 05/29/15   Ward, Honor Loh, MD    Allergies No Known Drug Allergies  No family history on file.  Social History Social History  Substance Use Topics  . Smoking status: Current Every Day Smoker    Last attempt to quit: 04/09/2015  . Smokeless tobacco: Never Used  . Alcohol use Yes     Comment: rare    Review of Systems Constitutional: No fever/chills Eyes: No visual changes. ENT: No sore throat. Cardiovascular: Denies chest pain. Respiratory: Denies shortness of breath. Gastrointestinal: positive for abdominal pain and nausea Genitourinary: Negative for dysuria. Musculoskeletal: Negative for neck pain.  Negative for back pain. Integumentary: Negative for rash. Neurological: Negative for headaches, focal weakness or numbness.   ____________________________________________   PHYSICAL EXAM:  VITAL SIGNS: ED Triage Vitals  Enc Vitals Group     BP 04/23/17 2140 (!) 169/103     Pulse Rate 04/23/17 2140 (!) 114     Resp 04/23/17 2140 18     Temp 04/23/17 2140 97.8 F (36.6 C)     Temp Source 04/23/17 2140 Oral     SpO2 04/23/17 2140 100 %  Weight 04/23/17 2140 113.4 kg (250 lb)     Height 04/23/17 2140 1.676 m (5\' 6" )     Head Circumference --      Peak Flow --      Pain Score 04/23/17 2139 7     Pain Loc --      Pain Edu? --      Excl. in [place]? --     Constitutional: Alert and oriented. Well appearing and in no acute distress. Eyes: Conjunctivae are normal. Head: Atraumatic. Mouth/Throat: Mucous membranes are moist.  Oropharynx non-erythematous. Neck: No stridor.   Cardiovascular: Normal rate, regular rhythm. Good peripheral circulation. Grossly normal heart  sounds. Respiratory: Normal respiratory effort.  No retractions. Lungs CTAB. Gastrointestinal: Right upper quadrant tenderness to palpation.. No distention.  Musculoskeletal: No lower extremity tenderness nor edema. No gross deformities of extremities. Neurologic:  Normal speech and language. No gross focal neurologic deficits are appreciated.  Skin:  Skin is warm, dry and intact. No rash noted. Psychiatric: Mood and affect are normal. Speech and behavior are normal.  ____________________________________________   LABS (all labs ordered are listed, but only abnormal results are displayed)  Labs Reviewed  COMPREHENSIVE METABOLIC PANEL - Abnormal; Notable for the following:       Result Value   Glucose, Bld 403 (*)    All other components within normal limits  CBC - Abnormal; Notable for the following:    WBC 12.4 (*)    All other components within normal limits  URINALYSIS, COMPLETE (UACMP) WITH MICROSCOPIC - Abnormal; Notable for the following:    Color, Urine YELLOW (*)    APPearance HAZY (*)    Glucose, UA >=500 (*)    Bacteria, UA RARE (*)    Squamous Epithelial / LPF 6-30 (*)    All other components within normal limits  LIPASE, BLOOD   ____________________________________________  EKG  ED ECG REPORT I, [place] N Seth Higginbotham, the attending physician, personally viewed and interpreted this ECG.   Date: 04/24/2017  EKG Time:9:43 PM  Rate: 107  Rhythm: Sinus Tachycardia  Axis: Normal  Intervals:Normal  ST&T Change: None  ____________________________________________  RADIOLOGY I, [place] N Zadok Holaway, personally viewed and evaluated these images (plain radiographs) as part of my medical decision making, as well as reviewing the written report by the radiologist.  Ct Abdomen Pelvis W Contrast  Result Date: 04/24/2017 CLINICAL DATA:  Generalized abdominal pain for 1 week, nausea and vomiting. History of hysterectomy, hypertension and diabetes. EXAM: CT ABDOMEN AND PELVIS WITH  CONTRAST TECHNIQUE: Multidetector CT imaging of the abdomen and pelvis was performed using the standard protocol following bolus administration of intravenous contrast. CONTRAST:  138mL ISOVUE-300 IOPAMIDOL (ISOVUE-300) INJECTION 61% COMPARISON:  RIGHT upper quadrant ultrasound April 24, 2017 at 0028 hours FINDINGS: LOWER CHEST: Lung bases are clear. Included heart size is normal. No pericardial effusion. HEPATOBILIARY: Liver and gallbladder are normal. PANCREAS: Normal. SPLEEN: Normal. ADRENALS/URINARY TRACT: Kidneys are orthotopic, demonstrating symmetric enhancement. No nephrolithiasis, hydronephrosis or solid renal masses. 14 mm exophytic simple cyst LEFT upper pole. The unopacified ureters are normal in course and caliber. Urinary bladder is well distended and unremarkable. Normal adrenal glands. STOMACH/BOWEL: The stomach, small and large bowel are normal in course and caliber without inflammatory changes. Normal appendix. VASCULAR/LYMPHATIC: Aortoiliac vessels are normal in course and caliber. Mild intimal thickening. No lymphadenopathy by CT size criteria. Mild "misty" mesentery with small reactive lymph nodes. REPRODUCTIVE: Status post hysterectomy. OTHER: No intraperitoneal free fluid or free air. MUSCULOSKELETAL: Nonacute. Mild sacroiliac osteoarthrosis.  Small wide necked fat containing supraumbilical ventral hernia. Anterior abdominal wall scarring. IMPRESSION: 1. No acute intra-abdominal or pelvic process.  Normal appendix. Electronically Signed   By: Elon Alas M.D.   On: 04/24/2017 02:18   US Abdomen Limited Ruq  Result Date: 04/24/2017 CLINICAL DATA:  Right upper quadrant pain for 1 week. Normal liver function studies. EXAM: ULTRASOUND ABDOMEN LIMITED RIGHT UPPER QUADRANT COMPARISON:  04/08/2010 FINDINGS: Gallbladder: No gallstones or wall thickening visualized. No sonographic Murphy sign noted by sonographer. Common bile duct: Diameter: 2.7 mm, normal Liver: Diffusely increased liver  parenchymal echotexture consistent with diffuse fatty infiltration. No focal lesions identified. Portal vein is patent on color Doppler imaging with normal direction of blood flow towards the liver. IMPRESSION: No evidence of cholelithiasis or cholecystitis. Diffuse fatty infiltration of the liver similar to previous study. Electronically Signed   By: Lucienne Capers M.D.   On: 04/24/2017 01:00     Procedures   ____________________________________________   INITIAL IMPRESSION / ASSESSMENT AND PLAN / ED COURSE  Pertinent labs & imaging results that were available during my care of the patient were reviewed by me and considered in my medical decision making (see chart for details).  36 year old female presenting with right upper quadrant tenderness to palpation concern for possible cholelithiasis versus gallbladder disease as such ultrasound performed which was negative. CT scan of the abdomen likewise did not reveal an etiology for the patient's discomfort. Patient will be referred to gastroenterology for further outpatient evaluation and management.      ____________________________________________  FINAL CLINICAL IMPRESSION(S) / ED DIAGNOSES  Final diagnoses:  RUQ pain  Right upper quadrant abdominal pain     MEDICATIONS GIVEN DURING THIS VISIT:  Medications  morphine 2 MG/ML injection 2 mg (2 mg Intravenous Given 04/24/17 0012)  ondansetron (ZOFRAN) injection 4 mg (4 mg Intravenous Given 04/24/17 0013)  sodium chloride 0.9 % bolus 1,000 mL (0 mLs Intravenous Stopped 04/24/17 0128)  iopamidol (ISOVUE-300) 61 % injection 100 mL (100 mLs Intravenous Contrast Given 04/24/17 0154)  ketorolac (TORADOL) 30 MG/ML injection 30 mg (30 mg Intravenous Given 04/24/17 0313)     NEW OUTPATIENT MEDICATIONS STARTED DURING THIS VISIT:  Discharge Medication List as of 04/24/2017  3:38 AM    START taking these medications   Details  pantoprazole (PROTONIX) 40 MG tablet Take 1 tablet (40 mg total)  by mouth daily., Starting Sun 04/24/2017, Until Mon 04/24/2018, Print        Discharge Medication List as of 04/24/2017  3:38 AM      Discharge Medication List as of 04/24/2017  3:38 AM       Note:  This document was prepared using Dragon voice recognition software and may include unintentional dictation errors.    Gregor Hams, MD 04/24/17 [phone]

## 2017-04-24 ENCOUNTER — Emergency Department: Payer: 59

## 2017-04-24 ENCOUNTER — Encounter: Payer: Self-pay | Admitting: Radiology

## 2017-04-24 MED ORDER — IOPAMIDOL (ISOVUE-300) INJECTION 61%
100.0000 mL | Freq: Once | INTRAVENOUS | Status: AC | PRN
  Administered 2017-04-24: 100 mL via INTRAVENOUS

## 2017-04-24 MED ORDER — PANTOPRAZOLE SODIUM 40 MG PO TBEC: 40.0000 mg | DELAYED_RELEASE_TABLET | Freq: Every day | ORAL | 0 refills | Status: DC

## 2017-04-24 MED ORDER — OXYCODONE-ACETAMINOPHEN 5-325 MG PO TABS
ORAL_TABLET | ORAL | Status: DC
Start: 2017-04-24 — End: 2017-04-24
  Filled 2017-04-24: qty 1

## 2017-04-24 MED ORDER — KETOROLAC TROMETHAMINE 30 MG/ML IJ SOLN
30.0000 mg | Freq: Once | INTRAMUSCULAR | Status: AC
  Administered 2017-04-24: 30 mg via INTRAVENOUS

## 2017-04-24 MED ORDER — KETOROLAC TROMETHAMINE 30 MG/ML IJ SOLN
INTRAMUSCULAR | Status: AC
  Filled 2017-04-24: qty 1

## 2017-04-24 NOTE — ED Notes (Signed)
Patient @ CT

## 2017-04-24 NOTE — ED Notes (Signed)
Patient returned from US.

## 2020-03-09 ENCOUNTER — Other Ambulatory Visit: Payer: Self-pay | Admitting: Internal Medicine

## 2020-04-03 ENCOUNTER — Other Ambulatory Visit: Payer: Self-pay

## 2020-04-03 ENCOUNTER — Ambulatory Visit: Payer: 59 | Admitting: Internal Medicine

## 2020-04-03 ENCOUNTER — Encounter: Payer: Self-pay | Admitting: Internal Medicine

## 2020-04-03 VITALS — BP 144/95 | HR 102 | Ht 66.0 in | Wt 267.2 lb

## 2020-04-03 DIAGNOSIS — E1165 Type 2 diabetes mellitus with hyperglycemia: Secondary | ICD-10-CM | POA: Insufficient documentation

## 2020-04-03 DIAGNOSIS — R635 Abnormal weight gain: Secondary | ICD-10-CM | POA: Insufficient documentation

## 2020-04-03 DIAGNOSIS — I119 Hypertensive heart disease without heart failure: Secondary | ICD-10-CM

## 2020-04-03 DIAGNOSIS — I7090 Unspecified atherosclerosis: Secondary | ICD-10-CM | POA: Insufficient documentation

## 2020-04-03 DIAGNOSIS — Z720 Tobacco use: Secondary | ICD-10-CM | POA: Insufficient documentation

## 2020-04-03 DIAGNOSIS — Z794 Long term (current) use of insulin: Secondary | ICD-10-CM

## 2020-04-03 MED ORDER — ROSUVASTATIN CALCIUM 20 MG PO TABS: 20.0000 mg | ORAL_TABLET | Freq: Every day | ORAL | 3 refills | Status: DC

## 2020-04-03 MED ORDER — INSULIN GLARGINE 100 UNIT/ML ~~LOC~~ SOLN: 50.0000 [IU] | Freq: Two times a day (BID) | SUBCUTANEOUS | 4 refills | Status: DC

## 2020-04-03 MED ORDER — LOSARTAN POTASSIUM 50 MG PO TABS: 50.0000 mg | ORAL_TABLET | Freq: Every day | ORAL | 3 refills | Status: DC

## 2020-04-03 NOTE — Assessment & Plan Note (Signed)
Blood pressure was found to be elevated so she was started on losartan 50 mg p.o. daily

## 2020-04-03 NOTE — Assessment & Plan Note (Signed)
We will start cholesterol to prevent atherosclerosis.  He has some evidence of vascular damage in the legs in the form of varicose veins.

## 2020-04-03 NOTE — Progress Notes (Signed)
Established Patient Office Visit  Subjective:  Patient ID: Ariana Lindsey, female    DOB: 1980-11-03  Age: 39 y.o. MRN: [id number]  CC:  Chief Complaint  Patient presents with  . Diabetes    patient complains of blood sugar fluctuating, this afternoon is 185    Diabetes She has type 2 diabetes mellitus. Her disease course has been worsening. Hypoglycemia symptoms include sweats. Pertinent negatives for hypoglycemia include no confusion, dizziness, headaches, mood changes, nervousness/anxiousness, pallor, seizures or speech difficulty. Pertinent negatives for diabetes include no blurred vision, no chest pain, no fatigue, no foot paresthesias, no polydipsia, no polyphagia, no polyuria, no visual change, no weakness and no weight loss. Pertinent negatives for hypoglycemia complications include no blackouts, no hospitalization and no nocturnal hypoglycemia. Pertinent negatives for diabetic complications include no autonomic neuropathy, CVA, heart disease, nephropathy, peripheral neuropathy or PVD. Risk factors for coronary artery disease include dyslipidemia, obesity, stress and sedentary lifestyle. Current diabetic treatment includes insulin injections and oral agent (triple therapy). She is following a diabetic and low fat/cholesterol diet. She rarely participates in exercise. Her home blood glucose trend is fluctuating dramatically. An ACE inhibitor/angiotensin II receptor blocker is being taken. She does not see a podiatrist.Eye exam is not current.    Victorino Dike presents for  diabetes  Past Medical History:  Diagnosis Date  . Diabetes mellitus without complication ([place])   . Hypertension     Past Surgical History:  Procedure Laterality Date  . CESAREAN SECTION     x 2  . LAPAROSCOPIC HYSTERECTOMY N/A 05/29/2015   Procedure: HYSTERECTOMY TOTAL LAPAROSCOPIC/BILATERAL SALPINGECTOMY;  Surgeon: Honor Loh Ward, MD;  Location: [facility] ORS;  Service: Gynecology;  Laterality: N/A;  . TUBAL  LIGATION      No family history on file.  Social History   Socioeconomic History  . Marital status: Single    Spouse name: Not on file  . Number of children: Not on file  . Years of education: Not on file  . Highest education level: Not on file  Occupational History  . Not on file  Tobacco Use  . Smoking status: Current Every Day Smoker    Last attempt to quit: 04/09/2015    Years since quitting: 4.9  . Smokeless tobacco: Never Used  Substance and Sexual Activity  . Alcohol use: Yes    Comment: rare  . Drug use: No  . Sexual activity: Not on file  Other Topics Concern  . Not on file  Social History Narrative  . Not on file   Social Determinants of Health   Financial Resource Strain:   . Difficulty of Paying Living Expenses: Not on file  Food Insecurity:   . Worried About Charity fundraiser in the Last Year: Not on file  . Ran Out of Food in the Last Year: Not on file  Transportation Needs:   . Lack of Transportation (Medical): Not on file  . Lack of Transportation (Non-Medical): Not on file  Physical Activity:   . Days of Exercise per Week: Not on file  . Minutes of Exercise per Session: Not on file  Stress:   . Feeling of Stress : Not on file  Social Connections:   . Frequency of Communication with Friends and Family: Not on file  . Frequency of Social Gatherings with Friends and Family: Not on file  . Attends Religious Services: Not on file  . Active Member of Clubs or Organizations: Not on file  . Attends Club  or Organization Meetings: Not on file  . Marital Status: Not on file  Intimate Partner Violence:   . Fear of Current or Ex-Partner: Not on file  . Emotionally Abused: Not on file  . Physically Abused: Not on file  . Sexually Abused: Not on file     Current Outpatient Medications:  .  acetaminophen (TYLENOL) 325 MG tablet, Take 2 tablets (650 mg total) by mouth every 4 (four) hours., Disp: , Rfl:  .  glipiZIDE (GLUCOTROL) 5 MG tablet, TAKE 1  TABLET BY MOUTH DAILY, Disp: 30 tablet, Rfl: 6 .  ibuprofen (ADVIL,MOTRIN) 600 MG tablet, Take 1 tablet (600 mg total) by mouth every 6 (six) hours., Disp: 45 tablet, Rfl: 0 .  insulin glargine (LANTUS) 100 UNIT/ML injection, Inject 0.5 mLs (50 Units total) into the skin in the morning and at bedtime., Disp: 90 mL, Rfl: 4 .  metFORMIN (GLUCOPHAGE) 1000 MG tablet, Take 1,000 mg by mouth 2 (two) times daily with a meal., Disp: , Rfl:  .  ondansetron (ZOFRAN) 4 MG tablet, Take 1 tablet (4 mg total) by mouth every 6 (six) hours as needed for nausea., Disp: 20 tablet, Rfl: 0 .  traMADol (ULTRAM) 50 MG tablet, Take 1 tablet (50 mg total) by mouth every 6 (six) hours as needed for moderate pain., Disp: 21 tablet, Rfl: 0 .  losartan (COZAAR) 50 MG tablet, Take 1 tablet (50 mg total) by mouth daily., Disp: 90 tablet, Rfl: 3 .  pantoprazole (PROTONIX) 40 MG tablet, Take 1 tablet (40 mg total) by mouth daily., Disp: 30 tablet, Rfl: 0 .  rosuvastatin (CRESTOR) 20 MG tablet, Take 1 tablet (20 mg total) by mouth daily., Disp: 90 tablet, Rfl: 3   No Known Allergies  ROS Review of Systems  Constitutional: Negative.  Negative for fatigue and weight loss.  HENT: Negative.  Negative for facial swelling, postnasal drip and sore throat.   Eyes: Negative.  Negative for blurred vision, redness and itching.  Respiratory: Negative.  Negative for cough and chest tightness.   Cardiovascular: Negative.  Negative for chest pain.  Gastrointestinal: Negative.   Endocrine: Negative.  Negative for polydipsia, polyphagia and polyuria.  Genitourinary: Negative for dysuria, frequency and urgency.  Musculoskeletal: Negative.  Negative for joint swelling.  Skin: Negative.  Negative for pallor.  Allergic/Immunologic: Negative.   Neurological: Negative.  Negative for dizziness, seizures, speech difficulty, weakness and headaches.  Hematological: Negative.   Psychiatric/Behavioral: Negative.  Negative for confusion. The patient  is not nervous/anxious.   All other systems reviewed and are negative.     Objective:    Physical Exam Vitals reviewed.  Constitutional:      General: She is not in acute distress.    Appearance: Normal appearance. She is obese. She is diaphoretic. She is not ill-appearing or toxic-appearing.  HENT:     Head: Normocephalic.     Nose: Nose normal.     Mouth/Throat:     Mouth: Mucous membranes are moist.     Pharynx: Oropharynx is clear.  Eyes:     Conjunctiva/sclera: Conjunctivae normal.     Pupils: Pupils are equal, round, and reactive to light.  Neck:     Vascular: No carotid bruit.  Cardiovascular:     Rate and Rhythm: Normal rate and regular rhythm.     Pulses: Normal pulses.     Heart sounds: Normal heart sounds.  Pulmonary:     Effort: Pulmonary effort is normal.     Breath sounds: Normal breath  sounds.  Abdominal:     General: Bowel sounds are normal.     Palpations: Abdomen is soft. There is no hepatomegaly, splenomegaly or mass.     Tenderness: There is no abdominal tenderness.     Hernia: No hernia is present.  Musculoskeletal:        General: No tenderness.     Cervical back: Neck supple.     Right lower leg: No edema.     Left lower leg: No edema.  Skin:    Coloration: Skin is not jaundiced.     Findings: No rash.          Comments: Varicose veins  Neurological:     Mental Status: She is alert and oriented to person, place, and time.     Motor: No weakness.  Psychiatric:        Mood and Affect: Mood and affect normal.        Behavior: Behavior normal.     BP (!) 144/95   Pulse (!) 102   Ht 5\' 6"  (1.676 m)   Wt 267 lb 3.2 oz (121.2 kg)   LMP 05/10/2015   BMI 43.13 kg/m  Wt Readings from Last 3 Encounters:  04/03/20 267 lb 3.2 oz (121.2 kg)  04/23/17 250 lb (113.4 kg)  05/26/15 234 lb (106.1 kg)     Health Maintenance Due  Topic Date Due  . Hepatitis C Screening  Never done  . PNEUMOCOCCAL POLYSACCHARIDE VACCINE AGE 43-64 HIGH RISK   Never done  . OPHTHALMOLOGY EXAM  Never done  . URINE MICROALBUMIN  Never done  . COVID-19 Vaccine (1) Never done  . HIV Screening  Never done  . TETANUS/TDAP  Never done  . PAP SMEAR-Modifier  Never done  . HEMOGLOBIN A1C  11/24/2015  . INFLUENZA VACCINE  03/16/2020    There are no preventive care reminders to display for this patient.  No results found for: TSH Lab Results  Component Value Date   WBC 12.4 (H) 04/23/2017   HGB 14.1 04/23/2017   HCT 41.1 04/23/2017   MCV 90.9 04/23/2017   PLT 295 04/23/2017   Lab Results  Component Value Date   NA 136 04/23/2017   K 3.9 04/23/2017   CO2 25 04/23/2017   GLUCOSE 403 (H) 04/23/2017   BUN 16 04/23/2017   CREATININE 0.93 04/23/2017   BILITOT 0.3 04/23/2017   ALKPHOS 65 04/23/2017   AST 20 04/23/2017   ALT 28 04/23/2017   PROT 7.5 04/23/2017   ALBUMIN 4.5 04/23/2017   CALCIUM 9.4 04/23/2017   ANIONGAP 10 04/23/2017   Lab Results  Component Value Date   CHOL 130 04/20/2013   Lab Results  Component Value Date   HDL 24 (L) 04/20/2013   Lab Results  Component Value Date   LDLCALC 76 04/20/2013   Lab Results  Component Value Date   TRIG 150 04/20/2013   No results found for: CHOLHDL Lab Results  Component Value Date   HGBA1C 8.8 (H) 05/26/2015      Assessment & Plan:   Problem List Items Addressed This Visit      Cardiovascular and Mediastinum   Atherosclerosis - Primary    We will start cholesterol to prevent atherosclerosis.  He has some evidence of vascular damage in the legs in the form of varicose veins.      Relevant Medications   rosuvastatin (CRESTOR) 20 MG tablet   losartan (COZAAR) 50 MG tablet   Hypertensive heart disease  Blood pressure was found to be elevated so she was started on losartan 50 mg p.o. daily      Relevant Medications   rosuvastatin (CRESTOR) 20 MG tablet   losartan (COZAAR) 50 MG tablet     Endocrine   Type 2 diabetes mellitus with hyperglycemia, with long-term  current use of insulin (HCC)    Out-of-control  BS with elevated hemoglobin AIC I increase patient insulin to 50 units twice a dayshe was advised to follow ADA diet.  We will see her back in a week for recheck on the blood sugar.      Relevant Medications   insulin glargine (LANTUS) 100 UNIT/ML injection   rosuvastatin (CRESTOR) 20 MG tablet   losartan (COZAAR) 50 MG tablet   Other Relevant Orders   Diabetes foot exam (Completed)     Other   Weight gain    - I encouraged the patient to lose weight.  - I educated them on making healthy dietary choices including eating more fruits and vegetables and less fried foods. - I encouraged the patient to exercise more, and educated on the benefits of exercise including weight loss, diabetes management, and hypertension management.           Meds ordered this encounter  Medications  . insulin glargine (LANTUS) 100 UNIT/ML injection    Sig: Inject 0.5 mLs (50 Units total) into the skin in the morning and at bedtime.    Dispense:  90 mL    Refill:  4  . rosuvastatin (CRESTOR) 20 MG tablet    Sig: Take 1 tablet (20 mg total) by mouth daily.    Dispense:  90 tablet    Refill:  3  . losartan (COZAAR) 50 MG tablet    Sig: Take 1 tablet (50 mg total) by mouth daily.    Dispense:  90 tablet    Refill:  3    Follow-up: No follow-ups on file.    Cletis Athens, MD

## 2020-04-03 NOTE — Assessment & Plan Note (Signed)
Out-of-control  BS with elevated hemoglobin AIC I increase patient insulin to 50 units twice a dayshe was advised to follow ADA diet.  We will see her back in a week for recheck on the blood sugar.

## 2020-04-03 NOTE — Assessment & Plan Note (Signed)
-   I encouraged the patient to lose weight.  - I educated them on making healthy dietary choices including eating more fruits and vegetables and less fried foods. - I encouraged the patient to exercise more, and educated on the benefits of exercise including weight loss, diabetes management, and hypertension management.   

## 2020-04-11 ENCOUNTER — Telehealth: Payer: Self-pay | Admitting: Internal Medicine

## 2020-04-11 NOTE — Telephone Encounter (Signed)
Instead of the latus she usually gets she got vials can we send in needles for her .

## 2020-04-14 MED ORDER — NEEDLES & SYRINGES MISC: 0 refills | Status: DC

## 2020-04-18 ENCOUNTER — Other Ambulatory Visit: Payer: Self-pay

## 2020-04-18 MED ORDER — NEEDLES & SYRINGES MISC: 0 refills | Status: DC

## 2020-04-25 ENCOUNTER — Other Ambulatory Visit: Payer: Self-pay

## 2020-04-25 ENCOUNTER — Ambulatory Visit (INDEPENDENT_AMBULATORY_CARE_PROVIDER_SITE_OTHER): Payer: Managed Care, Other (non HMO) | Admitting: Internal Medicine

## 2020-04-25 ENCOUNTER — Encounter: Payer: Self-pay | Admitting: Internal Medicine

## 2020-04-25 VITALS — BP 151/89 | HR 91 | Wt 270.1 lb

## 2020-04-25 DIAGNOSIS — R635 Abnormal weight gain: Secondary | ICD-10-CM

## 2020-04-25 DIAGNOSIS — E1165 Type 2 diabetes mellitus with hyperglycemia: Secondary | ICD-10-CM | POA: Diagnosis not present

## 2020-04-25 DIAGNOSIS — I10 Essential (primary) hypertension: Secondary | ICD-10-CM

## 2020-04-25 DIAGNOSIS — I7090 Unspecified atherosclerosis: Secondary | ICD-10-CM | POA: Diagnosis not present

## 2020-04-25 DIAGNOSIS — Z794 Long term (current) use of insulin: Secondary | ICD-10-CM

## 2020-04-25 NOTE — Progress Notes (Signed)
Established Patient Office Visit  SUBJECTIVE:  Subjective  Patient ID: Ariana Lindsey, female    DOB: 07-Jun-1981  Age: 39 y.o. MRN: [id number]  CC:  Chief Complaint  Patient presents with  . Diabetes    blood sugar 173    HPI Ariana Lindsey is a 39 y.o. female presenting today for a diabetes check.   Diabetes She presents for her follow-up diabetic visit. She has type 2 diabetes mellitus. No MedicAlert identification noted. Her disease course has been improving (173 today). Hypoglycemia symptoms include nervousness/anxiousness and tremors. Risk factors for coronary artery disease include diabetes mellitus, hypertension and stress. Current diabetic treatment includes insulin injections. She is compliant with treatment all of the time. She is following a generally healthy diet. Meal planning includes ADA exchanges.   This week she at two boiled eggs for breakfast. 2oz of almonds for a snack, an cabbage, tomato, onion, and bell pepper saute for lunch. She ate this every day for the last week.  She is setting a goal to lose 4 lbs by her next visit.    Past Medical History:  Diagnosis Date  . Diabetes mellitus without complication ([place])   . Hypertension     Past Surgical History:  Procedure Laterality Date  . CESAREAN SECTION     x 2  . LAPAROSCOPIC HYSTERECTOMY N/A 05/29/2015   Procedure: HYSTERECTOMY TOTAL LAPAROSCOPIC/BILATERAL SALPINGECTOMY;  Surgeon: Honor Loh Ward, MD;  Location: [facility] ORS;  Service: Gynecology;  Laterality: N/A;  . TUBAL LIGATION      History reviewed. No pertinent family history.  Social History   Socioeconomic History  . Marital status: Single    Spouse name: Not on file  . Number of children: Not on file  . Years of education: Not on file  . Highest education level: Not on file  Occupational History  . Not on file  Tobacco Use  . Smoking status: Current Every Day Smoker    Last attempt to quit: 04/09/2015    Years since quitting: 5.0  .  Smokeless tobacco: Never Used  Substance and Sexual Activity  . Alcohol use: Yes    Comment: rare  . Drug use: No  . Sexual activity: Not on file  Other Topics Concern  . Not on file  Social History Narrative  . Not on file   Social Determinants of Health   Financial Resource Strain:   . Difficulty of Paying Living Expenses: Not on file  Food Insecurity:   . Worried About Charity fundraiser in the Last Year: Not on file  . Ran Out of Food in the Last Year: Not on file  Transportation Needs:   . Lack of Transportation (Medical): Not on file  . Lack of Transportation (Non-Medical): Not on file  Physical Activity:   . Days of Exercise per Week: Not on file  . Minutes of Exercise per Session: Not on file  Stress:   . Feeling of Stress : Not on file  Social Connections:   . Frequency of Communication with Friends and Family: Not on file  . Frequency of Social Gatherings with Friends and Family: Not on file  . Attends Religious Services: Not on file  . Active Member of Clubs or Organizations: Not on file  . Attends Archivist Meetings: Not on file  . Marital Status: Not on file  Intimate Partner Violence:   . Fear of Current or Ex-Partner: Not on file  . Emotionally Abused: Not on file  .  Physically Abused: Not on file  . Sexually Abused: Not on file     Current Outpatient Medications:  .  acetaminophen (TYLENOL) 325 MG tablet, Take 2 tablets (650 mg total) by mouth every 4 (four) hours., Disp: , Rfl:  .  glipiZIDE (GLUCOTROL) 5 MG tablet, TAKE 1 TABLET BY MOUTH DAILY, Disp: 30 tablet, Rfl: 6 .  ibuprofen (ADVIL,MOTRIN) 600 MG tablet, Take 1 tablet (600 mg total) by mouth every 6 (six) hours., Disp: 45 tablet, Rfl: 0 .  insulin glargine (LANTUS) 100 UNIT/ML injection, Inject 0.5 mLs (50 Units total) into the skin in the morning and at bedtime., Disp: 90 mL, Rfl: 4 .  losartan (COZAAR) 50 MG tablet, Take 1 tablet (50 mg total) by mouth daily., Disp: 90 tablet, Rfl:  3 .  metFORMIN (GLUCOPHAGE) 1000 MG tablet, Take 1,000 mg by mouth 2 (two) times daily with a meal., Disp: , Rfl:  .  Needles & Syringes MISC, Use to inject insulin daily, Disp: 110 each, Rfl: 0 .  ondansetron (ZOFRAN) 4 MG tablet, Take 1 tablet (4 mg total) by mouth every 6 (six) hours as needed for nausea., Disp: 20 tablet, Rfl: 0 .  rosuvastatin (CRESTOR) 20 MG tablet, Take 1 tablet (20 mg total) by mouth daily., Disp: 90 tablet, Rfl: 3 .  traMADol (ULTRAM) 50 MG tablet, Take 1 tablet (50 mg total) by mouth every 6 (six) hours as needed for moderate pain., Disp: 21 tablet, Rfl: 0 .  pantoprazole (PROTONIX) 40 MG tablet, Take 1 tablet (40 mg total) by mouth daily., Disp: 30 tablet, Rfl: 0   No Known Allergies  ROS Review of Systems  Constitutional: Negative.   HENT: Negative.   Eyes: Negative.   Respiratory: Negative.   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  Gastrointestinal: Negative.   Endocrine: Negative.   Genitourinary: Negative.   Musculoskeletal: Negative.   Skin: Negative.   Allergic/Immunologic: Negative.   Neurological: Positive for tremors.  Hematological: Negative.   Psychiatric/Behavioral: The patient is nervous/anxious.   All other systems reviewed and are negative.    OBJECTIVE:    Physical Exam Vitals reviewed.  Constitutional:      Appearance: Normal appearance.  HENT:     Mouth/Throat:     Mouth: Mucous membranes are moist.  Eyes:     Pupils: Pupils are equal, round, and reactive to light.  Neck:     Vascular: No carotid bruit.  Cardiovascular:     Rate and Rhythm: Normal rate and regular rhythm.     Pulses: Normal pulses.     Heart sounds: Normal heart sounds.  Pulmonary:     Effort: Pulmonary effort is normal.     Breath sounds: Normal breath sounds.  Abdominal:     General: Bowel sounds are normal.     Palpations: Abdomen is soft. There is no hepatomegaly, splenomegaly or mass.     Tenderness: There is no abdominal  tenderness.     Hernia: No hernia is present.  Musculoskeletal:        General: No tenderness.     Cervical back: Neck supple.     Right lower leg: No edema.     Left lower leg: No edema.  Skin:    Findings: No rash.  Neurological:     Mental Status: She is alert and oriented to person, place, and time.     Motor: No weakness.  Psychiatric:        Mood and Affect: Mood normal. Affect is  tearful.        Behavior: Behavior normal.     BP (!) 151/89   Pulse 91   Wt 270 lb 1.6 oz (122.5 kg)   LMP 05/10/2015   BMI 43.60 kg/m  Wt Readings from Last 3 Encounters:  04/25/20 270 lb 1.6 oz (122.5 kg)  04/03/20 267 lb 3.2 oz (121.2 kg)  04/23/17 250 lb (113.4 kg)    Health Maintenance Due  Topic Date Due  . Hepatitis C Screening  Never done  . PNEUMOCOCCAL POLYSACCHARIDE VACCINE AGE 85-64 HIGH RISK  Never done  . OPHTHALMOLOGY EXAM  Never done  . COVID-19 Vaccine (1) Never done  . HIV Screening  Never done  . TETANUS/TDAP  Never done  . PAP SMEAR-Modifier  Never done  . HEMOGLOBIN A1C  11/24/2015  . INFLUENZA VACCINE  Never done    There are no preventive care reminders to display for this patient.  CBC Latest Ref Rng & Units 04/23/2017 05/26/2015 04/14/2015  WBC 3.6 - 11.0 K/uL 12.4(H) 11.6(H) 9.4  Hemoglobin 12.0 - 16.0 g/dL 14.1 12.6 15.0  Hematocrit 35 - 47 % 41.1 37.5 45.0  Platelets 150 - 440 K/uL 295 297 278   CMP Latest Ref Rng & Units 04/23/2017 05/26/2015 04/14/2015  Glucose 65 - 99 mg/dL 403(H) 131(H) 362(H)  BUN 6 - 20 mg/dL 16 14 14   Creatinine 0.44 - 1.00 mg/dL 0.93 0.45 0.52  Sodium 135 - 145 mmol/L 136 138 134(L)  Potassium 3.5 - 5.1 mmol/L 3.9 3.7 4.2  Chloride 101 - 111 mmol/L 101 107 101  CO2 22 - 32 mmol/L 25 25 24   Calcium 8.9 - 10.3 mg/dL 9.4 8.9 9.3  Total Protein 6.5 - 8.1 g/dL 7.5 - -  Total Bilirubin 0.3 - 1.2 mg/dL 0.3 - -  Alkaline Phos 38 - 126 U/L 65 - -  AST 15 - 41 U/L 20 - -  ALT 14 - 54 U/L 28 - -    No results found for: TSH Lab  Results  Component Value Date   ALBUMIN 4.5 04/23/2017   ANIONGAP 10 04/23/2017   Lab Results  Component Value Date   CHOL 130 04/20/2013   HDL 24 (L) 04/20/2013   LDLCALC 76 04/20/2013   Lab Results  Component Value Date   TRIG 150 04/20/2013   Lab Results  Component Value Date   HGBA1C 8.8 (H) 05/26/2015   HGBA1C 11.5 (H) 04/14/2015   HGBA1C 10.7 (H) 04/20/2013      ASSESSMENT & PLAN:   Problem List Items Addressed This Visit      Cardiovascular and Mediastinum   Atherosclerosis    Pt is on statin. We'll try to keep her blood sugar under control. She is on Cosaar for HTN.        Hypertension    - Today, the patient's blood pressure is well managed on losartan. - The patient will continue the current treatment regimen.  - I encouraged the patient to eat a low-sodium diet to help control blood pressure. - I encouraged the patient to live an active lifestyle and complete activities that increases heart rate to 85% target heart rate at least 5 times per week for one hour.            Endocrine   Type 2 diabetes mellitus with hyperglycemia, with long-term current use of insulin ([place]) - Primary    - The patient's blood sugar is under control on insulin and metformin. - The patient will  continue the current treatment regimen.  - I encouraged the patient to regularly check blood sugar.  - I encouraged the patient to monitor diet. I encouraged the patient to eat low-carb and low-sugar to help prevent blood sugar spikes.  - I encouraged the patient to continue following their prescribed treatment plan for diabetes - I informed the patient to get help if blood sugar drops below 54mg /dL, or if suddenly have trouble thinking clearly or breathing.            Other   Weight gain    - I encouraged the patient to lose weight.  - I educated them on making healthy dietary choices including eating more fruits and vegetables and less fried foods. - I encouraged the patient to  exercise more, and educated on the benefits of exercise including weight loss, diabetes management, and hypertension management.           No orders of the defined types were placed in this encounter.   Follow-up: Return in about 6 weeks (around 06/06/2020).    Cletis Athens, MD [facility] [address] [place], [place] [address]   By signing my name below, I, [employer], attest that this documentation has been prepared under the direction and in the presence of Dr. Cletis Athens Electronically Signed: Cletis Athens, MD 04/25/20, 5:28 PM  I personally performed the services described in this documentation, which was SCRIBED in my presence. The recorded information has been reviewed and considered accurate. It has been edited as necessary during review. Cletis Athens, MD

## 2020-04-25 NOTE — Assessment & Plan Note (Signed)
-   Today, the patient's blood pressure is well managed on losartan. - The patient will continue the current treatment regimen.  - I encouraged the patient to eat a low-sodium diet to help control blood pressure. - I encouraged the patient to live an active lifestyle and complete activities that increases heart rate to 85% target heart rate at least 5 times per week for one hour.     

## 2020-04-25 NOTE — Assessment & Plan Note (Signed)
-   I encouraged the patient to lose weight.  - I educated them on making healthy dietary choices including eating more fruits and vegetables and less fried foods. - I encouraged the patient to exercise more, and educated on the benefits of exercise including weight loss, diabetes management, and hypertension management.   

## 2020-04-25 NOTE — Assessment & Plan Note (Signed)
Pt is on statin. We'll try to keep her blood sugar under control. She is on Cosaar for HTN.

## 2020-04-25 NOTE — Assessment & Plan Note (Signed)
-   The patient's blood sugar is under control on insulin and metformin. - The patient will continue the current treatment regimen.  - I encouraged the patient to regularly check blood sugar.  - I encouraged the patient to monitor diet. I encouraged the patient to eat low-carb and low-sugar to help prevent blood sugar spikes.  - I encouraged the patient to continue following their prescribed treatment plan for diabetes - I informed the patient to get help if blood sugar drops below 54mg /dL, or if suddenly have trouble thinking clearly or breathing.

## 2020-05-21 ENCOUNTER — Other Ambulatory Visit: Payer: Self-pay | Admitting: Internal Medicine

## 2020-05-21 DIAGNOSIS — Z1231 Encounter for screening mammogram for malignant neoplasm of breast: Secondary | ICD-10-CM

## 2020-05-26 ENCOUNTER — Encounter: Payer: Self-pay | Admitting: Emergency Medicine

## 2020-05-26 ENCOUNTER — Emergency Department
Admission: EM | Admit: 2020-05-26 | Discharge: 2020-05-26 | Disposition: A | Discharge status: Home / Self Care | Payer: Managed Care, Other (non HMO) | Attending: Emergency Medicine | Admitting: Emergency Medicine

## 2020-05-26 ENCOUNTER — Other Ambulatory Visit: Payer: Self-pay

## 2020-05-26 ENCOUNTER — Emergency Department: Payer: Managed Care, Other (non HMO)

## 2020-05-26 DIAGNOSIS — Z20822 Contact with and (suspected) exposure to covid-19: Secondary | ICD-10-CM | POA: Insufficient documentation

## 2020-05-26 DIAGNOSIS — J029 Acute pharyngitis, unspecified: Secondary | ICD-10-CM | POA: Diagnosis present

## 2020-05-26 DIAGNOSIS — Z7984 Long term (current) use of oral hypoglycemic drugs: Secondary | ICD-10-CM | POA: Insufficient documentation

## 2020-05-26 DIAGNOSIS — Z794 Long term (current) use of insulin: Secondary | ICD-10-CM | POA: Diagnosis not present

## 2020-05-26 DIAGNOSIS — I10 Essential (primary) hypertension: Secondary | ICD-10-CM | POA: Insufficient documentation

## 2020-05-26 DIAGNOSIS — J392 Other diseases of pharynx: Secondary | ICD-10-CM

## 2020-05-26 DIAGNOSIS — Z87891 Personal history of nicotine dependence: Secondary | ICD-10-CM | POA: Diagnosis not present

## 2020-05-26 DIAGNOSIS — Z79899 Other long term (current) drug therapy: Secondary | ICD-10-CM | POA: Insufficient documentation

## 2020-05-26 DIAGNOSIS — E1165 Type 2 diabetes mellitus with hyperglycemia: Secondary | ICD-10-CM | POA: Diagnosis not present

## 2020-05-26 LAB — CBC WITH DIFFERENTIAL/PLATELET
Abs Immature Granulocytes: 0.04 10*3/uL (ref 0.00–0.07)
Basophils Absolute: 0.1 10*3/uL (ref 0.0–0.1)
Basophils Relative: 1 %
Eosinophils Absolute: 0.2 10*3/uL (ref 0.0–0.5)
Eosinophils Relative: 2 %
HCT: 37.3 % (ref 36.0–46.0)
Hemoglobin: 12.4 g/dL (ref 12.0–15.0)
Immature Granulocytes: 0 %
Lymphocytes Relative: 28 %
Lymphs Abs: 2.7 10*3/uL (ref 0.7–4.0)
MCH: 30.2 pg (ref 26.0–34.0)
MCHC: 33.2 g/dL (ref 30.0–36.0)
MCV: 91 fL (ref 80.0–100.0)
Monocytes Absolute: 0.6 10*3/uL (ref 0.1–1.0)
Monocytes Relative: 6 %
Neutro Abs: 6.2 10*3/uL (ref 1.7–7.7)
Neutrophils Relative %: 63 %
Platelets: 308 10*3/uL (ref 150–400)
RBC: 4.1 MIL/uL (ref 3.87–5.11)
RDW: 13.7 % (ref 11.5–15.5)
WBC: 9.8 10*3/uL (ref 4.0–10.5)
nRBC: 0 % (ref 0.0–0.2)

## 2020-05-26 LAB — BASIC METABOLIC PANEL
Anion gap: 10 (ref 5–15)
BUN: 11 mg/dL (ref 6–20)
CO2: 25 mmol/L (ref 22–32)
Calcium: 9.1 mg/dL (ref 8.9–10.3)
Chloride: 104 mmol/L (ref 98–111)
Creatinine, Ser: 0.58 mg/dL (ref 0.44–1.00)
GFR, Estimated: >60 mL/min (ref >60)
Glucose, Bld: 212 mg/dL — ABNORMAL HIGH (ref 70–99)
Potassium: 4 mmol/L (ref 3.5–5.1)
Sodium: 139 mmol/L (ref 135–145)

## 2020-05-26 LAB — RESPIRATORY PANEL BY RT PCR (FLU A&B, COVID)
Influenza A by PCR: NEGATIVE
Influenza B by PCR: NEGATIVE
SARS Coronavirus 2 by RT PCR: NEGATIVE

## 2020-05-26 LAB — LACTIC ACID, PLASMA: Lactic Acid, Venous: 0.9 mmol/L (ref 0.5–1.9)

## 2020-05-26 LAB — GROUP A STREP BY PCR: Group A Strep by PCR: NOT DETECTED

## 2020-05-26 MED ORDER — DEXAMETHASONE 1 MG PO TABS: ORAL_TABLET | ORAL | 0 refills | Status: AC

## 2020-05-26 MED ORDER — IOHEXOL 300 MG/ML  SOLN
75.0000 mL | Freq: Once | INTRAMUSCULAR | Status: AC | PRN
  Administered 2020-05-26: 75 mL via INTRAVENOUS

## 2020-05-26 MED ORDER — CLINDAMYCIN HCL 150 MG PO CAPS
300.0000 mg | ORAL_CAPSULE | Freq: Once | ORAL | Status: AC
  Administered 2020-05-26: 300 mg via ORAL
  Filled 2020-05-26: qty 2

## 2020-05-26 MED ORDER — CLINDAMYCIN HCL 150 MG PO CAPS: 450.0000 mg | ORAL_CAPSULE | Freq: Four times a day (QID) | ORAL | 0 refills | Status: AC

## 2020-05-26 MED ORDER — DEXAMETHASONE SODIUM PHOSPHATE 10 MG/ML IJ SOLN
10.0000 mg | Freq: Once | INTRAMUSCULAR | Status: AC
  Administered 2020-05-26: 10 mg via INTRAVENOUS
  Filled 2020-05-26: qty 1

## 2020-05-26 NOTE — ED Notes (Signed)
Dr. Karma Greaser to triage to see patient.

## 2020-05-26 NOTE — ED Provider Notes (Signed)
[facility] Emergency Department Provider Note   ____________________________________________   First MD Initiated Contact with Patient 05/26/20 1244     (approximate)  I have reviewed the triage vital signs and the nursing notes.   HISTORY  Chief Complaint Sore Throat    HPI Ariana Lindsey is a 39 y.o. female with past medical history of hypertension and diabetes who presents to the ED complaining of sore throat.  Patient reports that she has had a mild sore throat over the past couple of days, particularly affecting the left side of her throat.  Pain has been constant but has seemed to acutely worsen over the past 12 to 24 hours.  She feels like the left side of her throat is swollen and when she woke up this morning was having some difficulty breathing.  She was given steroids in triage, now states that difficulty breathing has resolved and she has having no difficulty swallowing her secretions.  She states it is painful for her to swallow but that she is able to keep down fluids.  She denies any recent fevers, was treated for a respiratory infection last week with a course of antibiotics, tested negative for COVID-19 at that time.        Past Medical History:  Diagnosis Date  . Diabetes mellitus without complication ([place])   . Hypertension     Patient Active Problem List   Diagnosis Date Noted  . Hypertension   . Atherosclerosis 04/03/2020  . Weight gain 04/03/2020  . Tobacco abuse 04/03/2020  . Type 2 diabetes mellitus with hyperglycemia, with long-term current use of insulin ([place]) 04/03/2020  . S/P total hysterectomy 05/29/2015    Past Surgical History:  Procedure Laterality Date  . CESAREAN SECTION     x 2  . LAPAROSCOPIC HYSTERECTOMY N/A 05/29/2015   Procedure: HYSTERECTOMY TOTAL LAPAROSCOPIC/BILATERAL SALPINGECTOMY;  Surgeon: Honor Loh Ward, MD;  Location: [facility] ORS;  Service: Gynecology;  Laterality: N/A;  . TUBAL LIGATION       Prior to Admission medications   Medication Sig Start Date End Date Taking? Authorizing Provider  acetaminophen (TYLENOL) 325 MG tablet Take 2 tablets (650 mg total) by mouth every 4 (four) hours. 05/29/15   Ward, Honor Loh, MD  clindamycin (CLEOCIN) 150 MG capsule Take 3 capsules (450 mg total) by mouth 4 (four) times daily for 7 days. 05/26/20 06/02/20  Blake Divine, MD  dexamethasone (DECADRON) 1 MG tablet Take 4 tablets (4 mg total) by mouth 2 (two) times daily with a meal for 1 day, THEN 3 tablets (3 mg total) 2 (two) times daily with a meal for 1 day, THEN 2 tablets (2 mg total) 2 (two) times daily with a meal for 1 day, THEN 1 tablet (1 mg total) 2 (two) times daily with a meal for 1 day. 05/26/20 05/30/20  Blake Divine, MD  glipiZIDE (GLUCOTROL) 5 MG tablet TAKE 1 TABLET BY MOUTH DAILY 03/10/20   Cletis Athens, MD  ibuprofen (ADVIL,MOTRIN) 600 MG tablet Take 1 tablet (600 mg total) by mouth every 6 (six) hours. 05/29/15   Ward, Honor Loh, MD  insulin glargine (LANTUS) 100 UNIT/ML injection Inject 0.5 mLs (50 Units total) into the skin in the morning and at bedtime. 04/03/20 07/02/20  Cletis Athens, MD  losartan (COZAAR) 50 MG tablet Take 1 tablet (50 mg total) by mouth daily. 04/03/20   Cletis Athens, MD  metFORMIN (GLUCOPHAGE) 1000 MG tablet Take 1,000 mg by mouth 2 (two) times daily with  a meal.    [provider]  Needles & Syringes MISC Use to inject insulin daily 04/18/20   Cletis Athens, MD  ondansetron (ZOFRAN) 4 MG tablet Take 1 tablet (4 mg total) by mouth every 6 (six) hours as needed for nausea. 05/29/15   Ward, Honor Loh, MD  pantoprazole (PROTONIX) 40 MG tablet Take 1 tablet (40 mg total) by mouth daily. 04/24/17 04/24/18  Gregor Hams, MD  rosuvastatin (CRESTOR) 20 MG tablet Take 1 tablet (20 mg total) by mouth daily. 04/03/20 07/02/20  Cletis Athens, MD  traMADol (ULTRAM) 50 MG tablet Take 1 tablet (50 mg total) by mouth every 6 (six) hours as needed for moderate  pain. 05/29/15   Ward, Honor Loh, MD    Allergies Patient has no known allergies.  No family history on file.  Social History Social History   Tobacco Use  . Smoking status: Former Research scientist (life sciences)  . Smokeless tobacco: Never Used  Substance Use Topics  . Alcohol use: Yes    Comment: rare  . Drug use: No    Review of Systems  Constitutional: No fever/chills Eyes: No visual changes. ENT: Positive for sore throat and difficulty swallowing. Cardiovascular: Denies chest pain. Respiratory: Denies shortness of breath. Gastrointestinal: No abdominal pain.  No nausea, no vomiting.  No diarrhea.  No constipation. Genitourinary: Negative for dysuria. Musculoskeletal: Negative for back pain. Skin: Negative for rash. Neurological: Negative for headaches, focal weakness or numbness.  ____________________________________________   PHYSICAL EXAM:  VITAL SIGNS: ED Triage Vitals  Enc Vitals Group     BP 05/26/20 0551 138/87     Pulse Rate 05/26/20 0551 98     Resp 05/26/20 0551 20     Temp 05/26/20 0551 98.1 F (36.7 C)     Temp Source 05/26/20 0551 Oral     SpO2 05/26/20 0551 100 %     Weight 05/26/20 0559 265 lb (120.2 kg)     Height 05/26/20 0559 5\' 6"  (1.676 m)     Head Circumference --      Peak Flow --      Pain Score 05/26/20 0559 1     Pain Loc --      Pain Edu? --      Excl. in [place]? --     Constitutional: Alert and oriented. Eyes: Conjunctivae are normal. Head: Atraumatic. Nose: No congestion/rhinnorhea. Mouth/Throat: Mucous membranes are moist.  Mild fullness noted to left posterior oropharynx with mild associated erythema.  No significant edema or uvular deviation.  Patient tolerating secretions without difficulty with normal voice.  Submandibular compartments are soft. Neck: Normal ROM Cardiovascular: Normal rate, regular rhythm. Grossly normal heart sounds. Respiratory: Normal respiratory effort.  No retractions. Lungs CTAB. Gastrointestinal: Soft and nontender. No  distention. Genitourinary: deferred Musculoskeletal: No lower extremity tenderness nor edema. Neurologic:  Normal speech and language. No gross focal neurologic deficits are appreciated. Skin:  Skin is warm, dry and intact. No rash noted. Psychiatric: Mood and affect are normal. Speech and behavior are normal.  ____________________________________________   LABS (all labs ordered are listed, but only abnormal results are displayed)  Labs Reviewed  BASIC METABOLIC PANEL - Abnormal; Notable for the following components:      Result Value   Glucose, Bld 212 (*)    All other components within normal limits  RESPIRATORY PANEL BY RT PCR (FLU A&B, COVID)  GROUP A STREP BY PCR  CBC WITH DIFFERENTIAL/PLATELET  LACTIC ACID, PLASMA    PROCEDURES  Procedure(s) performed (including  Critical Care):  Procedures   ____________________________________________   INITIAL IMPRESSION / ASSESSMENT AND PLAN / ED COURSE       39 year old female with past medical history of hypertension and diabetes who presents to the ED with a couple days of sore throat, acutely worsening overnight and causing some difficulty breathing.  She received IV Decadron from triage and difficulty breathing has now resolved.  She is having no difficulty tolerating her secretions and submandibular compartments appear soft.  Lab work is reassuring and CT soft tissue neck shows mild edema of her hypopharynx on the left with no evidence of peritonsillar abscess or other focal fluid collection.  Case discussed with Dr. Kathyrn Sheriff of ENT, who agrees that patient would be appropriate for outpatient management given she is tolerating secretions without any difficulty breathing.  We will start her on a course of clindamycin as well as steroid taper, patient may follow-up with ENT later this week.  She was counseled to return to the ED for any new or worsening symptoms, patient agrees with plan.       ____________________________________________   FINAL CLINICAL IMPRESSION(S) / ED DIAGNOSES  Final diagnoses:  Pharyngeal edema  Sore throat     ED Discharge Orders         Ordered    clindamycin (CLEOCIN) 150 MG capsule  4 times daily        05/26/20 1423    dexamethasone (DECADRON) 1 MG tablet        05/26/20 1423           Note:  This document was prepared using Dragon voice recognition software and may include unintentional dictation errors.   Blake Divine, MD 05/26/20 1425

## 2020-05-26 NOTE — ED Triage Notes (Signed)
Emergency Medicine Provider Triage Evaluation Note  Ariana Lindsey , a 39 y.o. female  was evaluated in triage.  Pt complains of sore throat and a feeling of fullness and difficulty swallowing. Sore throat has been present for several days but has gotten acutely worse over the last 12 to 24 hours. She reports that she is fully vaccinated for COVID-19 and went to an urgent care last week and was diagnosed with a viral respiratory infection but was negative for COVID-19. She was treated with antibiotics for presumed (bacterial) bronchitis.  Review of Systems  Positive: Sore throat, difficulty swallowing, feeling of fullness on the left side of her tongue in the back of her throat. Negative: Fever, nausea, vomiting, shortness of breath, cough, abdominal pain, chest pain.  Physical Exam  BP 138/87 (BP Location: Right Arm)   Pulse 98   Temp 98.1 F (36.7 C) (Oral)   Resp 20   Ht 1.676 m (5\' 6" )   Wt 120.2 kg   LMP 05/10/2015   SpO2 100%   BMI 42.77 kg/m  Gen:   Awake, no distress  HEENT:  Posterior oropharynx appears full on the left side, possibly consistent with peritonsillar abscess. Airway is patent. No obvious angioedema. Tolerating secretions, speaking in normal voice (not hoarse, no "hot potato" voice). Mild tenderness to palpation of the left side of the neck in the submandibular region but no palpable lymphadenopathy and no brawny induration. No induration under the tongue, no indication of Ludwig's angina. Resp:  Normal effort . Able to speak in full sentences without difficulty. Cardiac:  Normal rate  Abd:   Nondistended, nontender  MSK:   Moves extremities without difficulty  Neuro:  Speech clear   Medical Decision Making  Medically screening exam initiated at 6:17 AM.  Appropriate orders placed.  AKIVA JOSEY was informed that the remainder of the evaluation will be completed by another provider, this initial triage assessment does not replace that evaluation, and the  importance of remaining in the ED until their evaluation is complete.  Clinical Impression  Possible peritonsillar abscess or other deep space neck infection. Epiglottitis/tonsillitis is also possible although I think less likely.   Plan:  No airway compromise at this time, no difficulty swallowing. Checking basic labs including BMP, CBC, and lactic acid. Ordering Decadron 10 mg IV for the swelling, holding off on antibiotics for now until it is more clear if this is in fact due to a bacterial infection. Ordered CT soft tissue neck with IV contrast for further evaluation.   Hinda Kehr, MD 05/26/20 [phone]

## 2020-05-26 NOTE — ED Triage Notes (Signed)
Patient states that about 2 weeks ago that she was diagnosed with an URI and sinus infection. Patient states that she was given antibiotics. Patient states that her symptoms had improved. Patient states that this morning she started feeling like there is swelling in her throat that is making it hard for her to swallow.

## 2020-05-26 NOTE — Discharge Instructions (Signed)
Please schedule follow-up with an ear, nose, and throat specialist for later this week.  If you have worsening difficulty swallowing or breathing, or any other worsening symptoms, please return to the ER for reevaluation.  You should closely monitor your blood sugar levels while you are on the course of steroids.

## 2020-05-30 ENCOUNTER — Ambulatory Visit: Payer: Managed Care, Other (non HMO) | Admitting: Family Medicine

## 2020-05-30 ENCOUNTER — Encounter: Payer: Self-pay | Admitting: Family Medicine

## 2020-05-30 ENCOUNTER — Other Ambulatory Visit: Payer: Self-pay

## 2020-05-30 VITALS — BP 155/92 | HR 90 | Ht 66.0 in | Wt 260.6 lb

## 2020-05-30 DIAGNOSIS — E1165 Type 2 diabetes mellitus with hyperglycemia: Secondary | ICD-10-CM

## 2020-05-30 DIAGNOSIS — Z794 Long term (current) use of insulin: Secondary | ICD-10-CM | POA: Diagnosis not present

## 2020-05-30 LAB — GLUCOSE, POCT (MANUAL RESULT ENTRY): POC Glucose: 179 mg/dl — AB (ref 70–99)

## 2020-05-30 MED ORDER — FLUCONAZOLE 150 MG PO TABS: 150.0000 mg | ORAL_TABLET | Freq: Once | ORAL | 0 refills | Status: AC

## 2020-05-30 NOTE — Progress Notes (Addendum)
Established Patient Office Visit  SUBJECTIVE:  Subjective  Patient ID: Ariana Lindsey, female    DOB: [birth year]  Age: 39 y.o. MRN: [id number]  CC:  Chief Complaint  Patient presents with  . Follow-up    Patient is here today for a diabetes follow up    HPI Ariana Lindsey is a 39 y.o. female presenting today for a diabetes follow up.   She was recently placed on prednisone due to some pharyngeal edema, which raised her blood sugar recently. Her blood sugar today was 179. She states that her blood sugar is everywhere.    Past Medical History:  Diagnosis Date  . Diabetes mellitus without complication ([place])   . Hypertension     Past Surgical History:  Procedure Laterality Date  . CESAREAN SECTION     x 2  . LAPAROSCOPIC HYSTERECTOMY N/A 05/29/2015   Procedure: HYSTERECTOMY TOTAL LAPAROSCOPIC/BILATERAL SALPINGECTOMY;  Surgeon: Honor Loh Ward, MD;  Location: [facility] ORS;  Service: Gynecology;  Laterality: N/A;  . TUBAL LIGATION      History reviewed. No pertinent family history.  Social History   Socioeconomic History  . Marital status: Single    Spouse name: Not on file  . Number of children: Not on file  . Years of education: Not on file  . Highest education level: Not on file  Occupational History  . Not on file  Tobacco Use  . Smoking status: Former Research scientist (life sciences)  . Smokeless tobacco: Never Used  Substance and Sexual Activity  . Alcohol use: Yes    Comment: rare  . Drug use: No  . Sexual activity: Not on file  Other Topics Concern  . Not on file  Social History Narrative  . Not on file   Social Determinants of Health   Financial Resource Strain:   . Difficulty of Paying Living Expenses: Not on file  Food Insecurity:   . Worried About [employer] fund[place] in the Last Year: Not on file  . Ran Out of Food in the Last Year: Not on file  Transportation Needs:   . Lack of Transportation (Medical): Not on file  . Lack of Transportation (Non-Medical): Not on file    Physical Activity:   . Days of Exercise per Week: Not on file  . Minutes of Exercise per Session: Not on file  Stress:   . Feeling of Stress : Not on file  Social Connections:   . Frequency of Communication with Friends and Family: Not on file  . Frequency of Social Gatherings with Friends and Family: Not on file  . Attends Religious Services: Not on file  . Active Member of Clubs or Organizations: Not on file  . Attends Archivist Meetings: Not on file  . Marital Status: Not on file  Intimate Partner Violence:   . Fear of Current or Ex-Partner: Not on file  . Emotionally Abused: Not on file  . Physically Abused: Not on file  . Sexually Abused: Not on file     Current Outpatient Medications:  .  glipiZIDE (GLUCOTROL) 5 MG tablet, TAKE 1 TABLET BY MOUTH DAILY, Disp: 30 tablet, Rfl: 6 .  insulin glargine (LANTUS) 100 UNIT/ML injection, Inject 0.5 mLs (50 Units total) into the skin in the morning and at bedtime., Disp: 90 mL, Rfl: 4 .  losartan (COZAAR) 50 MG tablet, Take 1 tablet (50 mg total) by mouth daily., Disp: 90 tablet, Rfl: 3 .  metFORMIN (GLUCOPHAGE) 1000 MG tablet, Take 1,000 mg  by mouth 2 (two) times daily with a meal., Disp: , Rfl:  .  Needles & Syringes MISC, Use to inject insulin daily, Disp: 110 each, Rfl: 0 .  rosuvastatin (CRESTOR) 20 MG tablet, Take 1 tablet (20 mg total) by mouth daily., Disp: 90 tablet, Rfl: 3 .  Insulin Syringe-Needle U-100 (INSULIN SYRINGE 1CC/31GX5/16") 31G X 5/16" 1 ML MISC, Inject 50 Units into the skin 2 (two) times daily., Disp: 100 each, Rfl: 0 .  Semaglutide,0.25 or 0.5MG /DOS, (OZEMPIC, 0.25 OR 0.5 MG/DOSE,) 2 MG/1.5ML SOPN, Inject 0.5 mg into the skin once a week., Disp: 1.5 mL, Rfl: 1   No Known Allergies  ROS Review of Systems  Constitutional: Negative.   HENT: Positive for postnasal drip and sore throat.   Eyes: Negative.   Respiratory: Negative.   Cardiovascular: Negative.   Gastrointestinal: Negative.   Endocrine:  Negative.   Genitourinary: Negative.   Musculoskeletal: Negative.   Skin: Negative.   Allergic/Immunologic: Negative.   Neurological: Negative.   Hematological: Negative.   Psychiatric/Behavioral: Negative.   All other systems reviewed and are negative.    OBJECTIVE:    Physical Exam Vitals reviewed.  Constitutional:      Appearance: Normal appearance. She is obese.  HENT:     Mouth/Throat:     Mouth: Mucous membranes are moist.  Eyes:     Pupils: Pupils are equal, round, and reactive to light.  Neck:     Vascular: No carotid bruit.  Cardiovascular:     Rate and Rhythm: Normal rate and regular rhythm.     Pulses: Normal pulses.     Heart sounds: Normal heart sounds.  Pulmonary:     Effort: Pulmonary effort is normal.     Breath sounds: Normal breath sounds.  Abdominal:     General: Bowel sounds are normal.     Palpations: Abdomen is soft. There is no hepatomegaly, splenomegaly or mass.     Tenderness: There is no abdominal tenderness.     Hernia: No hernia is present.  Musculoskeletal:        General: No tenderness.     Cervical back: Neck supple.     Right lower leg: No edema.     Left lower leg: No edema.  Skin:    Findings: No rash.  Neurological:     Mental Status: She is alert and oriented to person, place, and time.     Motor: No weakness.  Psychiatric:        Mood and Affect: Mood and affect normal.        Behavior: Behavior normal.     BP (!) 155/92   Pulse 90   Ht 5\' 6"  (1.676 m)   Wt 260 lb 9.6 oz (118.2 kg)   LMP 05/10/2015   BMI 42.06 kg/m  Wt Readings from Last 3 Encounters:  06/13/20 261 lb 3.2 oz (118.5 kg)  05/30/20 260 lb 9.6 oz (118.2 kg)  05/26/20 265 lb (120.2 kg)    Health Maintenance Due  Topic Date Due  . Hepatitis C Screening  Never done  . PNEUMOCOCCAL POLYSACCHARIDE VACCINE AGE 56-64 HIGH RISK  Never done  . OPHTHALMOLOGY EXAM  Never done  . COVID-19 Vaccine (1) Never done  . HIV Screening  Never done  . TETANUS/TDAP   Never done  . PAP SMEAR-Modifier  Never done  . INFLUENZA VACCINE  Never done    There are no preventive care reminders to display for this patient.  CBC Latest Ref Rng &  Units 05/26/2020 04/23/2017 05/26/2015  WBC 4.0 - 10.5 K/uL 9.8 12.4(H) 11.6(H)  Hemoglobin 12.0 - 15.0 g/dL 12.4 14.1 12.6  Hematocrit 36 - 46 % 37.3 41.1 37.5  Platelets 150 - 400 K/uL 308 295 297   CMP Latest Ref Rng & Units 05/26/2020 04/23/2017 05/26/2015  Glucose 70 - 99 mg/dL 212(H) 403(H) 131(H)  BUN 6 - 20 mg/dL 11 16 14   Creatinine 0.44 - 1.00 mg/dL 0.58 0.93 0.45  Sodium 135 - 145 mmol/L 139 136 138  Potassium 3.5 - 5.1 mmol/L 4.0 3.9 3.7  Chloride 98 - 111 mmol/L 104 101 107  CO2 22 - 32 mmol/L 25 25 25   Calcium 8.9 - 10.3 mg/dL 9.1 9.4 8.9  Total Protein 6.5 - 8.1 g/dL - 7.5 -  Total Bilirubin 0.3 - 1.2 mg/dL - 0.3 -  Alkaline Phos 38 - 126 U/L - 65 -  AST 15 - 41 U/L - 20 -  ALT 14 - 54 U/L - 28 -    No results found for: TSH Lab Results  Component Value Date   ALBUMIN 4.5 04/23/2017   ANIONGAP 10 05/26/2020   Lab Results  Component Value Date   CHOL 130 04/20/2013   HDL 24 (L) 04/20/2013   LDLCALC 76 04/20/2013   Lab Results  Component Value Date   TRIG 150 04/20/2013   Lab Results  Component Value Date   HGBA1C 8.0 (H) 05/30/2020   HGBA1C 8.8 (H) 05/26/2015   HGBA1C 11.5 (H) 04/14/2015      ASSESSMENT & PLAN:   Problem List Items Addressed This Visit      Endocrine   Type 2 diabetes mellitus with hyperglycemia, with long-term current use of insulin (HCC) - Primary    Diabetes mellitus Type II, under poor control.. Discussed general issues about diabetes pathophysiology and management. Labs: hemoglobin A1C. Discussed Ozempic for DM control. Will call insurance to discuss the price.       Relevant Orders   POCT glucose (manual entry) (Completed)   POCT HgB A1C   Hemoglobin A1c (Completed)      Meds ordered this encounter  Medications  . fluconazole (DIFLUCAN) 150 MG  tablet    Sig: Take 1 tablet (150 mg total) by mouth once for 1 dose.    Dispense:  1 tablet    Refill:  0      Follow-up: No follow-ups on file.    Beckie Salts, [place] [address], [place], [place] [address]   By signing my name below, I, Courtney Paris, attest that this documentation has been prepared under the direction and in the presence of Beckie Salts, FNP Electronically Signed: Beckie Salts, [place] 06/19/20, 11:18 AM  I personally performed the services described in this documentation, which was SCRIBED in my presence. The recorded information has been reviewed and considered accurate. It has been edited as necessary during review. Beckie Salts, FNP

## 2020-05-30 NOTE — Assessment & Plan Note (Addendum)
Diabetes mellitus Type II, under poor control.. Discussed general issues about diabetes pathophysiology and management. Labs: hemoglobin A1C. Discussed Ozempic for DM control. Will call insurance to discuss the price.

## 2020-05-31 LAB — HEMOGLOBIN A1C
Hgb A1c MFr Bld: 8 % of total Hgb — ABNORMAL HIGH (ref <5.7)
Mean Plasma Glucose: 183 (calc)
eAG (mmol/L): 10.1 (calc)

## 2020-06-05 ENCOUNTER — Telehealth: Payer: Self-pay

## 2020-06-09 ENCOUNTER — Other Ambulatory Visit: Payer: Self-pay | Admitting: Internal Medicine

## 2020-06-11 ENCOUNTER — Other Ambulatory Visit: Payer: Self-pay

## 2020-06-11 MED ORDER — "INSULIN SYRINGE 31G X 5/16"" 1 ML MISC": 50.0000 [IU] | Freq: Two times a day (BID) | 0 refills | Status: DC

## 2020-06-13 ENCOUNTER — Encounter: Payer: Self-pay | Admitting: Family Medicine

## 2020-06-13 ENCOUNTER — Ambulatory Visit (INDEPENDENT_AMBULATORY_CARE_PROVIDER_SITE_OTHER): Payer: Managed Care, Other (non HMO) | Admitting: Family Medicine

## 2020-06-13 ENCOUNTER — Other Ambulatory Visit: Payer: Self-pay

## 2020-06-13 VITALS — BP 152/81 | HR 103 | Ht 66.0 in | Wt 261.2 lb

## 2020-06-13 DIAGNOSIS — Z794 Long term (current) use of insulin: Secondary | ICD-10-CM

## 2020-06-13 DIAGNOSIS — I10 Essential (primary) hypertension: Secondary | ICD-10-CM

## 2020-06-13 DIAGNOSIS — E1165 Type 2 diabetes mellitus with hyperglycemia: Secondary | ICD-10-CM | POA: Diagnosis not present

## 2020-06-13 MED ORDER — OZEMPIC (0.25 OR 0.5 MG/DOSE) 2 MG/1.5ML ~~LOC~~ SOPN: 0.5000 mg | PEN_INJECTOR | SUBCUTANEOUS | 1 refills | Status: DC

## 2020-06-13 NOTE — Assessment & Plan Note (Addendum)
Diabetes mellitus Type II, under satisfactory control.. Discussed general issues about diabetes pathophysiology and management. Discussed foot care.  A1C went from 10.4 in Aug to 8 in October after increasing Lantus to 100 units Q HS. Continue Lantus and f/u 3 months, Diet discussed.    Also starting Ozempic 0.25 Q week and Decreasing Lantus to 50 units.

## 2020-06-13 NOTE — Progress Notes (Signed)
Established Patient Office Visit  SUBJECTIVE:  Subjective  Patient ID: Ariana Lindsey, female    DOB: [birth year]  Age: 39 y.o. MRN: [id number]  CC:  Chief Complaint  Patient presents with   Diabetes    patient checked blood sugar and it was 224    HPI Ariana Lindsey is a 39 y.o. female presenting today for DM fu    Past Medical History:  Diagnosis Date   Diabetes mellitus without complication ([place])    Hypertension     Past Surgical History:  Procedure Laterality Date   CESAREAN SECTION     x 2   LAPAROSCOPIC HYSTERECTOMY N/A 05/29/2015   Procedure: HYSTERECTOMY TOTAL LAPAROSCOPIC/BILATERAL SALPINGECTOMY;  Surgeon: Honor Loh Ward, MD;  Location: [facility] ORS;  Service: Gynecology;  Laterality: N/A;   TUBAL LIGATION      History reviewed. No pertinent family history.  Social History   Socioeconomic History   Marital status: Single    Spouse name: Not on file   Number of children: Not on file   Years of education: Not on file   Highest education level: Not on file  Occupational History   Not on file  Tobacco Use   Smoking status: Former Smoker   Smokeless tobacco: Never Used  Substance and Sexual Activity   Alcohol use: Yes    Comment: rare   Drug use: No   Sexual activity: Not on file  Other Topics Concern   Not on file  Social History Narrative   Not on file   Social Determinants of Health   Financial Resource Strain:    Difficulty of Paying Living Expenses: Not on file  Food Insecurity:    Worried About [place] in the Last Year: Not on file   Ran Out of Food in the Last Year: Not on file  Transportation Needs:    Lack of Transportation (Medical): Not on file   Lack of Transportation (Non-Medical): Not on file  Physical Activity:    Days of Exercise per Week: Not on file   Minutes of Exercise per Session: Not on file  Stress:    Feeling of Stress : Not on file  Social Connections:    Frequency of  Communication with Friends and Family: Not on file   Frequency of Social Gatherings with Friends and Family: Not on file   Attends Religious Services: Not on file   Active Member of [place] or Organizations: Not on file   Attends Archivist Meetings: Not on file   Marital Status: Not on file  Intimate Partner Violence:    Fear of Current or Ex-Partner: Not on file   Emotionally Abused: Not on file   Physically Abused: Not on file   Sexually Abused: Not on file     Current Outpatient Medications:    glipiZIDE (GLUCOTROL) 5 MG tablet, TAKE 1 TABLET BY MOUTH DAILY, Disp: 30 tablet, Rfl: 6   insulin glargine (LANTUS) 100 UNIT/ML injection, Inject 0.5 mLs (50 Units total) into the skin in the morning and at bedtime., Disp: 90 mL, Rfl: 4   Insulin Syringe-Needle U-100 (INSULIN SYRINGE 1CC/31GX5/16") 31G X 5/16" 1 ML MISC, Inject 50 Units into the skin 2 (two) times daily., Disp: 100 each, Rfl: 0   losartan (COZAAR) 50 MG tablet, Take 1 tablet (50 mg total) by mouth daily., Disp: 90 tablet, Rfl: 3   metFORMIN (GLUCOPHAGE) 1000 MG tablet, Take 1,000 mg by mouth 2 (two) times daily with a  meal., Disp: , Rfl:    Needles & Syringes MISC, Use to inject insulin daily, Disp: 110 each, Rfl: 0   rosuvastatin (CRESTOR) 20 MG tablet, Take 1 tablet (20 mg total) by mouth daily., Disp: 90 tablet, Rfl: 3   No Known Allergies  ROS Review of Systems  Constitutional: Negative.   HENT: Negative.   Respiratory: Negative.   Cardiovascular: Negative.   Gastrointestinal: Negative.   Endocrine: Negative.   Genitourinary: Negative.   Musculoskeletal: Negative.   Hematological: Negative.   Psychiatric/Behavioral: Negative.      OBJECTIVE:    Physical Exam Constitutional:      Appearance: She is obese.  HENT:     Nose: Nose normal.     Mouth/Throat:     Mouth: Mucous membranes are moist.  Eyes:     Pupils: Pupils are equal, round, and reactive to light.  Cardiovascular:      Rate and Rhythm: Normal rate.  Pulmonary:     Effort: Pulmonary effort is normal.  Musculoskeletal:     Cervical back: Normal range of motion.  Neurological:     Mental Status: She is alert.  Psychiatric:        Mood and Affect: Mood normal.     BP (!) 152/81    Pulse (!) 103    Ht 5\' 6"  (1.676 m)    Wt 261 lb 3.2 oz (118.5 kg)    LMP 05/10/2015    BMI 42.16 kg/m  Wt Readings from Last 3 Encounters:  06/13/20 261 lb 3.2 oz (118.5 kg)  05/30/20 260 lb 9.6 oz (118.2 kg)  05/26/20 265 lb (120.2 kg)    Health Maintenance Due  Topic Date Due   Hepatitis C Screening  Never done   PNEUMOCOCCAL POLYSACCHARIDE VACCINE AGE 69-64 HIGH RISK  Never done   OPHTHALMOLOGY EXAM  Never done   COVID-19 Vaccine (1) Never done   HIV Screening  Never done   TETANUS/TDAP  Never done   PAP SMEAR-Modifier  Never done   INFLUENZA VACCINE  Never done    There are no preventive care reminders to display for this patient.  CBC Latest Ref Rng & Units 05/26/2020 04/23/2017 05/26/2015  WBC 4.0 - 10.5 K/uL 9.8 12.4(H) 11.6(H)  Hemoglobin 12.0 - 15.0 g/dL 12.4 14.1 12.6  Hematocrit 36 - 46 % 37.3 41.1 37.5  Platelets 150 - 400 K/uL 308 295 297   CMP Latest Ref Rng & Units 05/26/2020 04/23/2017 05/26/2015  Glucose 70 - 99 mg/dL 212(H) 403(H) 131(H)  BUN 6 - 20 mg/dL 11 16 14   Creatinine 0.44 - 1.00 mg/dL 0.58 0.93 0.45  Sodium 135 - 145 mmol/L 139 136 138  Potassium 3.5 - 5.1 mmol/L 4.0 3.9 3.7  Chloride 98 - 111 mmol/L 104 101 107  CO2 22 - 32 mmol/L 25 25 25   Calcium 8.9 - 10.3 mg/dL 9.1 9.4 8.9  Total Protein 6.5 - 8.1 g/dL - 7.5 -  Total Bilirubin 0.3 - 1.2 mg/dL - 0.3 -  Alkaline Phos 38 - 126 U/L - 65 -  AST 15 - 41 U/L - 20 -  ALT 14 - 54 U/L - 28 -    No results found for: TSH Lab Results  Component Value Date   ALBUMIN 4.5 04/23/2017   ANIONGAP 10 05/26/2020   Lab Results  Component Value Date   CHOL 130 04/20/2013   HDL 24 (L) 04/20/2013   LDLCALC 76 04/20/2013   Lab  Results  Component Value Date  TRIG 150 04/20/2013   Lab Results  Component Value Date   HGBA1C 8.0 (H) 05/30/2020   HGBA1C 8.8 (H) 05/26/2015   HGBA1C 11.5 (H) 04/14/2015      ASSESSMENT & PLAN:   Problem List Items Addressed This Visit      Cardiovascular and Mediastinum   Hypertension    Patient's blood pressure is not within the desired range.  An office visit is recommended. Medication side effects include: no side effects noted Continue current treatment regimen. Dietary sodium restriction.         Endocrine   Type 2 diabetes mellitus with hyperglycemia, with long-term current use of insulin (HCC) - Primary    Diabetes mellitus Type II, under satisfactory control.. Discussed general issues about diabetes pathophysiology and management. Discussed foot care.  A1C went from 10.4 in Aug to 8 in October after increasing Lantus to 100 units Q HS. Continue Lantus and f/u 3 months, Diet discussed.            No orders of the defined types were placed in this encounter.     Follow-up: No follow-ups on file.    Beckie Salts, [place] [address] [place],  [address]

## 2020-06-13 NOTE — Assessment & Plan Note (Signed)
Patient's blood pressure is not within the desired range.  An office visit is recommended. Medication side effects include: no side effects noted Continue current treatment regimen. Dietary sodium restriction.

## 2020-06-20 ENCOUNTER — Other Ambulatory Visit: Payer: Self-pay

## 2020-06-20 ENCOUNTER — Ambulatory Visit
Admission: RE | Admit: 2020-06-20 | Discharge: 2020-06-20 | Disposition: A | Discharge status: Home / Self Care | Payer: Managed Care, Other (non HMO) | Source: Ambulatory Visit | Attending: Internal Medicine | Admitting: Internal Medicine

## 2020-06-20 DIAGNOSIS — Z1231 Encounter for screening mammogram for malignant neoplasm of breast: Secondary | ICD-10-CM | POA: Insufficient documentation

## 2020-06-27 ENCOUNTER — Other Ambulatory Visit: Payer: Self-pay

## 2020-06-27 ENCOUNTER — Encounter: Payer: Self-pay | Admitting: Family Medicine

## 2020-06-27 ENCOUNTER — Ambulatory Visit: Payer: Managed Care, Other (non HMO) | Admitting: Family Medicine

## 2020-06-27 VITALS — BP 130/82 | HR 106 | Ht 66.0 in | Wt 261.0 lb

## 2020-06-27 DIAGNOSIS — Z794 Long term (current) use of insulin: Secondary | ICD-10-CM | POA: Diagnosis not present

## 2020-06-27 DIAGNOSIS — E1165 Type 2 diabetes mellitus with hyperglycemia: Secondary | ICD-10-CM

## 2020-06-27 LAB — GLUCOSE, POCT (MANUAL RESULT ENTRY): POC Glucose: 137 mg/dl — AB (ref 70–99)

## 2020-06-27 NOTE — Progress Notes (Signed)
Established Patient Office Visit  SUBJECTIVE:  Subjective  Patient ID: Ariana Lindsey, female    DOB: 12/21/1980  Age: 39 y.o. MRN: [id number]  CC:  Chief Complaint  Patient presents with   Diabetes    Patient is here for a 2 week follow up    HPI Ariana Lindsey is a 39 y.o. female presenting today for DM control  Past Medical History:  Diagnosis Date   Diabetes mellitus without complication ([place])    Hypertension     Past Surgical History:  Procedure Laterality Date   CESAREAN SECTION     x 2   LAPAROSCOPIC HYSTERECTOMY N/A 05/29/2015   Procedure: HYSTERECTOMY TOTAL LAPAROSCOPIC/BILATERAL SALPINGECTOMY;  Surgeon: Honor Loh Ward, MD;  Location: [facility] ORS;  Service: Gynecology;  Laterality: N/A;   TUBAL LIGATION      Family History  Problem Relation Age of Onset   Breast cancer Paternal Grandmother     Social History   Socioeconomic History   Marital status: Single    Spouse name: Not on file   Number of children: Not on file   Years of education: Not on file   Highest education level: Not on file  Occupational History   Not on file  Tobacco Use   Smoking status: Former Smoker   Smokeless tobacco: Never Used  Substance and Sexual Activity   Alcohol use: Yes    Comment: rare   Drug use: No   Sexual activity: Not on file  Other Topics Concern   Not on file  Social History Narrative   Not on file   Social Determinants of Health   Financial Resource Strain:    Difficulty of Paying Living Expenses: Not on file  Food Insecurity:    Worried About [place] in the Last Year: Not on file   Ran Out of Food in the Last Year: Not on file  Transportation Needs:    Lack of Transportation (Medical): Not on file   Lack of Transportation (Non-Medical): Not on file  Physical Activity:    Days of Exercise per Week: Not on file   Minutes of Exercise per Session: Not on file  Stress:    Feeling of Stress : Not on file  Social  Connections:    Frequency of Communication with Friends and Family: Not on file   Frequency of Social Gatherings with Friends and Family: Not on file   Attends Religious Services: Not on file   Active Member of [place] or Organizations: Not on file   Attends Archivist Meetings: Not on file   Marital Status: Not on file  Intimate Partner Violence:    Fear of Current or Ex-Partner: Not on file   Emotionally Abused: Not on file   Physically Abused: Not on file   Sexually Abused: Not on file     Current Outpatient Medications:    glipiZIDE (GLUCOTROL) 5 MG tablet, TAKE 1 TABLET BY MOUTH DAILY, Disp: 30 tablet, Rfl: 6   insulin glargine (LANTUS) 100 UNIT/ML injection, Inject 0.5 mLs (50 Units total) into the skin in the morning and at bedtime., Disp: 90 mL, Rfl: 4   Insulin Syringe-Needle U-100 (INSULIN SYRINGE 1CC/31GX5/16") 31G X 5/16" 1 ML MISC, Inject 50 Units into the skin 2 (two) times daily., Disp: 100 each, Rfl: 0   losartan (COZAAR) 50 MG tablet, Take 1 tablet (50 mg total) by mouth daily., Disp: 90 tablet, Rfl: 3   metFORMIN (GLUCOPHAGE) 1000 MG tablet, Take  1,000 mg by mouth 2 (two) times daily with a meal., Disp: , Rfl:    Needles & Syringes MISC, Use to inject insulin daily, Disp: 110 each, Rfl: 0   rosuvastatin (CRESTOR) 20 MG tablet, Take 1 tablet (20 mg total) by mouth daily., Disp: 90 tablet, Rfl: 3   Semaglutide,0.25 or 0.5MG /DOS, (OZEMPIC, 0.25 OR 0.5 MG/DOSE,) 2 MG/1.5ML SOPN, Inject 0.5 mg into the skin once a week., Disp: 1.5 mL, Rfl: 1   No Known Allergies  ROS Review of Systems  Constitutional: Negative.   HENT: Negative.   Respiratory: Negative.   Cardiovascular: Negative.   Gastrointestinal: Negative.   Genitourinary: Negative.   Musculoskeletal: Negative.   Skin: Negative.   Psychiatric/Behavioral: Negative.      OBJECTIVE:    Physical Exam HENT:     Right Ear: Tympanic membrane normal.     Nose: Nose normal.      Mouth/Throat:     Mouth: Mucous membranes are moist.  Eyes:     Pupils: Pupils are equal, round, and reactive to light.  Cardiovascular:     Rate and Rhythm: Normal rate and regular rhythm.  Musculoskeletal:        General: Normal range of motion.     Cervical back: Normal range of motion.  Neurological:     Mental Status: She is alert.     BP 130/82    Pulse (!) 106    Ht 5\' 6"  (1.676 m)    Wt 261 lb (118.4 kg)    LMP 05/10/2015    BMI 42.13 kg/m  Wt Readings from Last 3 Encounters:  06/27/20 261 lb (118.4 kg)  06/13/20 261 lb 3.2 oz (118.5 kg)  05/30/20 260 lb 9.6 oz (118.2 kg)    Health Maintenance Due  Topic Date Due   Hepatitis C Screening  Never done   PNEUMOCOCCAL POLYSACCHARIDE VACCINE AGE 88-64 HIGH RISK  Never done   OPHTHALMOLOGY EXAM  Never done   COVID-19 Vaccine (1) Never done   HIV Screening  Never done   TETANUS/TDAP  Never done   PAP SMEAR-Modifier  Never done   INFLUENZA VACCINE  Never done    There are no preventive care reminders to display for this patient.  CBC Latest Ref Rng & Units 05/26/2020 04/23/2017 05/26/2015  WBC 4.0 - 10.5 K/uL 9.8 12.4(H) 11.6(H)  Hemoglobin 12.0 - 15.0 g/dL 12.4 14.1 12.6  Hematocrit 36 - 46 % 37.3 41.1 37.5  Platelets 150 - 400 K/uL 308 295 297   CMP Latest Ref Rng & Units 05/26/2020 04/23/2017 05/26/2015  Glucose 70 - 99 mg/dL 212(H) 403(H) 131(H)  BUN 6 - 20 mg/dL 11 16 14   Creatinine 0.44 - 1.00 mg/dL 0.58 0.93 0.45  Sodium 135 - 145 mmol/L 139 136 138  Potassium 3.5 - 5.1 mmol/L 4.0 3.9 3.7  Chloride 98 - 111 mmol/L 104 101 107  CO2 22 - 32 mmol/L 25 25 25   Calcium 8.9 - 10.3 mg/dL 9.1 9.4 8.9  Total Protein 6.5 - 8.1 g/dL - 7.5 -  Total Bilirubin 0.3 - 1.2 mg/dL - 0.3 -  Alkaline Phos 38 - 126 U/L - 65 -  AST 15 - 41 U/L - 20 -  ALT 14 - 54 U/L - 28 -    No results found for: TSH Lab Results  Component Value Date   ALBUMIN 4.5 04/23/2017   ANIONGAP 10 05/26/2020   Lab Results  Component Value  Date   CHOL 130 04/20/2013  HDL 24 (L) 04/20/2013   LDLCALC 76 04/20/2013   Lab Results  Component Value Date   TRIG 150 04/20/2013   Lab Results  Component Value Date   HGBA1C 8.0 (H) 05/30/2020   HGBA1C 8.8 (H) 05/26/2015   HGBA1C 11.5 (H) 04/14/2015      ASSESSMENT & PLAN:   Problem List Items Addressed This Visit      Endocrine   Type 2 diabetes mellitus with hyperglycemia, with long-term current use of insulin (HCC) - Primary    Discussed Ozempic use, started 3 weeks ago, Glucose readings have been stable with 100 units of Lantus 50 units bid. She is still on the .25 of Ozempic. And will increase to the next level in 1 week.  Plan- I gave her a sample of the Libra glucose monitor patch and asked her to fu 1 month.       Relevant Orders   POCT glucose (manual entry) (Completed)      No orders of the defined types were placed in this encounter.     Follow-up: No follow-ups on file.    Beckie Salts, [place] [address] [place], [place] [address]

## 2020-06-27 NOTE — Assessment & Plan Note (Signed)
Discussed Ozempic use, started 3 weeks ago, Glucose readings have been stable with 100 units of Lantus 50 units bid. She is still on the .25 of Ozempic. And will increase to the next level in 1 week.  Plan- I gave her a sample of the Libra glucose monitor patch and asked her to fu 1 month.

## 2020-07-25 ENCOUNTER — Other Ambulatory Visit: Payer: Self-pay

## 2020-07-25 ENCOUNTER — Encounter: Payer: Self-pay | Admitting: Family Medicine

## 2020-07-25 ENCOUNTER — Ambulatory Visit: Payer: Managed Care, Other (non HMO) | Admitting: Family Medicine

## 2020-07-25 VITALS — BP 128/89 | HR 95 | Ht 66.0 in | Wt 257.8 lb

## 2020-07-25 DIAGNOSIS — Z794 Long term (current) use of insulin: Secondary | ICD-10-CM | POA: Diagnosis not present

## 2020-07-25 DIAGNOSIS — E1165 Type 2 diabetes mellitus with hyperglycemia: Secondary | ICD-10-CM

## 2020-07-25 MED ORDER — GLUCOSE BLOOD VI STRP: ORAL_STRIP | 12 refills | Status: DC

## 2020-07-25 NOTE — Assessment & Plan Note (Addendum)
Pt is doing well on Ozempic, she went from .25 to .50 today. Her glucose has been less than 120 but no readings under 80. She is trying a new monitor for continuous glucose monitoring. Plan- Fu in 1 month.

## 2020-07-25 NOTE — Progress Notes (Signed)
Established Patient Office Visit  SUBJECTIVE:  Subjective  Patient ID: Ariana Lindsey, female    DOB: 05-11-1981  Age: 39 y.o. MRN: [id number]  CC:  Chief Complaint  Patient presents with  . Diabetes    Patient checked blood sugar at 12 and it was 111 and after lunch it was 151    HPI Ariana Lindsey is a 39 y.o. female presenting today for     Past Medical History:  Diagnosis Date  . Diabetes mellitus without complication ([place])   . Hypertension     Past Surgical History:  Procedure Laterality Date  . CESAREAN SECTION     x 2  . LAPAROSCOPIC HYSTERECTOMY N/A 05/29/2015   Procedure: HYSTERECTOMY TOTAL LAPAROSCOPIC/BILATERAL SALPINGECTOMY;  Surgeon: Honor Loh Ward, MD;  Location: [facility] ORS;  Service: Gynecology;  Laterality: N/A;  . TUBAL LIGATION      Family History  Problem Relation Age of Onset  . Breast cancer Paternal Grandmother     Social History   Socioeconomic History  . Marital status: Single    Spouse name: Not on file  . Number of children: Not on file  . Years of education: Not on file  . Highest education level: Not on file  Occupational History  . Not on file  Tobacco Use  . Smoking status: Former Research scientist (life sciences)  . Smokeless tobacco: Never Used  Substance and Sexual Activity  . Alcohol use: Yes    Comment: rare  . Drug use: No  . Sexual activity: Not on file  Other Topics Concern  . Not on file  Social History Narrative  . Not on file   Social Determinants of Health   Financial Resource Strain: Not on file  Food Insecurity: Not on file  Transportation Needs: Not on file  Physical Activity: Not on file  Stress: Not on file  Social Connections: Not on file  Intimate Partner Violence: Not on file     Current Outpatient Medications:  .  glipiZIDE (GLUCOTROL) 5 MG tablet, TAKE 1 TABLET BY MOUTH DAILY, Disp: 30 tablet, Rfl: 6 .  Insulin Syringe-Needle U-100 (INSULIN SYRINGE 1CC/31GX5/16") 31G X 5/16" 1 ML MISC, Inject 50 Units into the skin  2 (two) times daily., Disp: 100 each, Rfl: 0 .  losartan (COZAAR) 50 MG tablet, Take 1 tablet (50 mg total) by mouth daily., Disp: 90 tablet, Rfl: 3 .  metFORMIN (GLUCOPHAGE) 1000 MG tablet, Take 1,000 mg by mouth 2 (two) times daily with a meal., Disp: , Rfl:  .  Needles & Syringes MISC, Use to inject insulin daily, Disp: 110 each, Rfl: 0 .  Semaglutide,0.25 or 0.5MG /DOS, (OZEMPIC, 0.25 OR 0.5 MG/DOSE,) 2 MG/1.5ML SOPN, Inject 0.5 mg into the skin once a week., Disp: 1.5 mL, Rfl: 1 .  glucose blood test strip, Use as instructed, Disp: 100 each, Rfl: 12 .  insulin glargine (LANTUS) 100 UNIT/ML injection, Inject 0.5 mLs (50 Units total) into the skin in the morning and at bedtime., Disp: 90 mL, Rfl: 4 .  rosuvastatin (CRESTOR) 20 MG tablet, Take 1 tablet (20 mg total) by mouth daily., Disp: 90 tablet, Rfl: 3   No Known Allergies  ROS Review of Systems  Constitutional: Negative.   HENT: Negative.   Respiratory: Negative.   Cardiovascular: Negative.   Gastrointestinal: Negative.   Genitourinary: Negative.   Musculoskeletal: Negative.   Skin: Negative.   Psychiatric/Behavioral: Negative.      OBJECTIVE:    Physical Exam Constitutional:  Appearance: She is obese.  HENT:     Head: Normocephalic and atraumatic.     Mouth/Throat:     Mouth: Mucous membranes are moist.  Eyes:     Pupils: Pupils are equal, round, and reactive to light.  Cardiovascular:     Rate and Rhythm: Normal rate and regular rhythm.     Pulses: Normal pulses.  Abdominal:     General: Abdomen is flat.  Musculoskeletal:        General: Normal range of motion.  Skin:    General: Skin is warm.  Neurological:     Mental Status: She is alert.  Psychiatric:        Mood and Affect: Mood normal.     BP 128/89   Pulse 95   Ht 5\' 6"  (1.676 m)   Wt 257 lb 12.8 oz (116.9 kg)   LMP 05/10/2015   BMI 41.61 kg/m  Wt Readings from Last 3 Encounters:  07/25/20 257 lb 12.8 oz (116.9 kg)  06/27/20 261 lb (118.4  kg)  06/13/20 261 lb 3.2 oz (118.5 kg)    Health Maintenance Due  Topic Date Due  . Hepatitis C Screening  Never done  . PNEUMOCOCCAL POLYSACCHARIDE VACCINE AGE 8-64 HIGH RISK  Never done  . COVID-19 Vaccine (1) Never done  . OPHTHALMOLOGY EXAM  Never done  . HIV Screening  Never done  . TETANUS/TDAP  Never done  . PAP SMEAR-Modifier  Never done  . INFLUENZA VACCINE  Never done    There are no preventive care reminders to display for this patient.  CBC Latest Ref Rng & Units 05/26/2020 04/23/2017 05/26/2015  WBC 4.0 - 10.5 K/uL 9.8 12.4(H) 11.6(H)  Hemoglobin 12.0 - 15.0 g/dL 12.4 14.1 12.6  Hematocrit 36.0 - 46.0 % 37.3 41.1 37.5  Platelets 150 - 400 K/uL 308 295 297   CMP Latest Ref Rng & Units 05/26/2020 04/23/2017 05/26/2015  Glucose 70 - 99 mg/dL 212(H) 403(H) 131(H)  BUN 6 - 20 mg/dL 11 16 14   Creatinine 0.44 - 1.00 mg/dL 0.58 0.93 0.45  Sodium 135 - 145 mmol/L 139 136 138  Potassium 3.5 - 5.1 mmol/L 4.0 3.9 3.7  Chloride 98 - 111 mmol/L 104 101 107  CO2 22 - 32 mmol/L 25 25 25   Calcium 8.9 - 10.3 mg/dL 9.1 9.4 8.9  Total Protein 6.5 - 8.1 g/dL - 7.5 -  Total Bilirubin 0.3 - 1.2 mg/dL - 0.3 -  Alkaline Phos 38 - 126 U/L - 65 -  AST 15 - 41 U/L - 20 -  ALT 14 - 54 U/L - 28 -    No results found for: TSH Lab Results  Component Value Date   ALBUMIN 4.5 04/23/2017   ANIONGAP 10 05/26/2020   Lab Results  Component Value Date   CHOL 130 04/20/2013   HDL 24 (L) 04/20/2013   LDLCALC 76 04/20/2013   Lab Results  Component Value Date   TRIG 150 04/20/2013   Lab Results  Component Value Date   HGBA1C 8.0 (H) 05/30/2020   HGBA1C 8.8 (H) 05/26/2015   HGBA1C 11.5 (H) 04/14/2015      ASSESSMENT & PLAN:   Problem List Items Addressed This Visit      Endocrine   Type 2 diabetes mellitus with hyperglycemia, with long-term current use of insulin ([place]) - Primary      Meds ordered this encounter  Medications  . glucose blood test strip    Sig: Use as  instructed  Dispense:  100 each    Refill:  12      Follow-up: No follow-ups on file.    Beckie Salts, [place] [address], [place], [place] [address]

## 2020-09-03 ENCOUNTER — Other Ambulatory Visit: Payer: Self-pay | Admitting: Internal Medicine

## 2020-09-10 ENCOUNTER — Ambulatory Visit (INDEPENDENT_AMBULATORY_CARE_PROVIDER_SITE_OTHER): Payer: Managed Care, Other (non HMO) | Admitting: Internal Medicine

## 2020-09-10 ENCOUNTER — Other Ambulatory Visit: Payer: Self-pay

## 2020-09-10 DIAGNOSIS — R635 Abnormal weight gain: Secondary | ICD-10-CM

## 2020-09-10 DIAGNOSIS — E1165 Type 2 diabetes mellitus with hyperglycemia: Secondary | ICD-10-CM

## 2020-09-10 DIAGNOSIS — I10 Essential (primary) hypertension: Secondary | ICD-10-CM

## 2020-09-10 DIAGNOSIS — Z794 Long term (current) use of insulin: Secondary | ICD-10-CM

## 2020-09-11 ENCOUNTER — Other Ambulatory Visit: Payer: Self-pay | Admitting: Family Medicine

## 2020-09-11 DIAGNOSIS — N6489 Other specified disorders of breast: Secondary | ICD-10-CM

## 2020-09-11 DIAGNOSIS — R928 Other abnormal and inconclusive findings on diagnostic imaging of breast: Secondary | ICD-10-CM

## 2020-09-11 LAB — CBC WITH DIFFERENTIAL/PLATELET
Basophils Absolute: 0.1 10*3/uL (ref 0.0–0.2)
Basos: 1 %
EOS (ABSOLUTE): 0.2 10*3/uL (ref 0.0–0.4)
Eos: 3 %
Hematocrit: 39.9 % (ref 34.0–46.6)
Hemoglobin: 13.4 g/dL (ref 11.1–15.9)
Immature Grans (Abs): 0.1 10*3/uL (ref 0.0–0.1)
Immature Granulocytes: 1 %
Lymphocytes Absolute: 3 10*3/uL (ref 0.7–3.1)
Lymphs: 39 %
MCH: 29.3 pg (ref 26.6–33.0)
MCHC: 33.6 g/dL (ref 31.5–35.7)
MCV: 87 fL (ref 79–97)
Monocytes Absolute: 0.4 10*3/uL (ref 0.1–0.9)
Monocytes: 5 %
Neutrophils Absolute: 3.9 10*3/uL (ref 1.4–7.0)
Neutrophils: 51 %
Platelets: 315 10*3/uL (ref 150–450)
RBC: 4.57 x10E6/uL (ref 3.77–5.28)
RDW: 12.7 % (ref 11.7–15.4)
WBC: 7.6 10*3/uL (ref 3.4–10.8)

## 2020-09-11 LAB — HEMOGLOBIN A1C
Est. average glucose Bld gHb Est-mCnc: 160 mg/dL
Hgb A1c MFr Bld: 7.2 % — ABNORMAL HIGH (ref 4.8–5.6)

## 2020-09-11 LAB — LIPID PANEL
Chol/HDL Ratio: 3.3 ratio (ref 0.0–4.4)
Cholesterol, Total: 116 mg/dL (ref 100–199)
HDL: 35 mg/dL — ABNORMAL LOW (ref >39)
LDL Chol Calc (NIH): 60 mg/dL (ref 0–99)
Triglycerides: 116 mg/dL (ref 0–149)
VLDL Cholesterol Cal: 21 mg/dL (ref 5–40)

## 2020-09-11 LAB — TSH: TSH: 1.28 u[IU]/mL (ref 0.450–4.500)

## 2020-09-12 ENCOUNTER — Other Ambulatory Visit: Payer: Self-pay

## 2020-09-12 ENCOUNTER — Encounter: Payer: Self-pay | Admitting: Family Medicine

## 2020-09-12 ENCOUNTER — Ambulatory Visit: Payer: Managed Care, Other (non HMO) | Admitting: Family Medicine

## 2020-09-12 VITALS — BP 139/91 | HR 104 | Ht 66.0 in | Wt 259.5 lb

## 2020-09-12 DIAGNOSIS — Z794 Long term (current) use of insulin: Secondary | ICD-10-CM | POA: Diagnosis not present

## 2020-09-12 DIAGNOSIS — E1165 Type 2 diabetes mellitus with hyperglycemia: Secondary | ICD-10-CM

## 2020-09-12 MED ORDER — FREESTYLE LIBRE 2 SENSOR MISC: 1.0000 | 6 refills | Status: AC

## 2020-09-15 ENCOUNTER — Other Ambulatory Visit: Payer: Self-pay | Admitting: Internal Medicine

## 2020-09-17 ENCOUNTER — Ambulatory Visit
Admission: RE | Admit: 2020-09-17 | Discharge: 2020-09-17 | Disposition: A | Discharge status: Home / Self Care | Payer: Managed Care, Other (non HMO) | Source: Ambulatory Visit | Attending: Family Medicine | Admitting: Family Medicine

## 2020-09-17 ENCOUNTER — Other Ambulatory Visit: Payer: Self-pay

## 2020-09-17 DIAGNOSIS — R928 Other abnormal and inconclusive findings on diagnostic imaging of breast: Secondary | ICD-10-CM

## 2020-09-17 DIAGNOSIS — N6489 Other specified disorders of breast: Secondary | ICD-10-CM | POA: Insufficient documentation

## 2020-10-10 ENCOUNTER — Encounter: Payer: Self-pay | Admitting: Family Medicine

## 2020-10-10 NOTE — Progress Notes (Signed)
Established Patient Office Visit  SUBJECTIVE:  Subjective  Patient ID: Ariana Lindsey, female    DOB: 10-02-1980  Age: 40 y.o. MRN: [id number]  CC:  Chief Complaint  Patient presents with  . Diabetes    Patient checked blood sugar and it was 133    HPI Ariana Lindsey is a 40 y.o. female presenting today for     Past Medical History:  Diagnosis Date  . Diabetes mellitus without complication ([place])   . Hypertension     Past Surgical History:  Procedure Laterality Date  . CESAREAN SECTION     x 2  . LAPAROSCOPIC HYSTERECTOMY N/A 05/29/2015   Procedure: HYSTERECTOMY TOTAL LAPAROSCOPIC/BILATERAL SALPINGECTOMY;  Surgeon: Honor Loh Ward, MD;  Location: [facility] ORS;  Service: Gynecology;  Laterality: N/A;  . TUBAL LIGATION      Family History  Problem Relation Age of Onset  . Breast cancer Paternal Grandmother     Social History   Socioeconomic History  . Marital status: Single    Spouse name: Not on file  . Number of children: Not on file  . Years of education: Not on file  . Highest education level: Not on file  Occupational History  . Not on file  Tobacco Use  . Smoking status: Former Research scientist (life sciences)  . Smokeless tobacco: Never Used  Substance and Sexual Activity  . Alcohol use: Yes    Comment: rare  . Drug use: No  . Sexual activity: Not on file  Other Topics Concern  . Not on file  Social History Narrative  . Not on file   Social Determinants of Health   Financial Resource Strain: Not on file  Food Insecurity: Not on file  Transportation Needs: Not on file  Physical Activity: Not on file  Stress: Not on file  Social Connections: Not on file  Intimate Partner Violence: Not on file     Current Outpatient Medications:  .  BD PEN NEEDLE NANO 2ND GEN 32G X 4 MM MISC, USE TO INJECT INSULIN DAILY, Disp: 100 each, Rfl: 6 .  BD PEN NEEDLE NANO 2ND GEN 32G X 4 MM MISC, USE TO INJECT INSULIN DAILY, Disp: 100 each, Rfl: 6 .  Continuous Blood Gluc Sensor (FREESTYLE  LIBRE 2 SENSOR) MISC, 1 Product by Does not apply route every 14 (fourteen) days., Disp: 1 each, Rfl: 6 .  glipiZIDE (GLUCOTROL) 5 MG tablet, TAKE 1 TABLET BY MOUTH DAILY, Disp: 30 tablet, Rfl: 6 .  glucose blood test strip, Use as instructed, Disp: 100 each, Rfl: 12 .  Insulin Syringe-Needle U-100 (INSULIN SYRINGE 1CC/31GX5/16") 31G X 5/16" 1 ML MISC, Inject 50 Units into the skin 2 (two) times daily., Disp: 100 each, Rfl: 0 .  LANTUS SOLOSTAR 100 UNIT/ML Solostar Pen, INJECT 60 UNITS UNDER THE SKIN EVERY DAY, Disp: 54 mL, Rfl: 6 .  LANTUS SOLOSTAR 100 UNIT/ML Solostar Pen, INJECT 60 UNITS UNDER THE SKIN EVERY DAY, Disp: 54 mL, Rfl: 6 .  losartan (COZAAR) 50 MG tablet, Take 1 tablet (50 mg total) by mouth daily., Disp: 90 tablet, Rfl: 3 .  metFORMIN (GLUCOPHAGE) 1000 MG tablet, Take 1,000 mg by mouth 2 (two) times daily with a meal., Disp: , Rfl:  .  Needles & Syringes MISC, Use to inject insulin daily, Disp: 110 each, Rfl: 0 .  Semaglutide,0.25 or 0.5MG /DOS, (OZEMPIC, 0.25 OR 0.5 MG/DOSE,) 2 MG/1.5ML SOPN, Inject 0.5 mg into the skin once a week., Disp: 1.5 mL, Rfl: 1 .  insulin glargine (  LANTUS) 100 UNIT/ML injection, Inject 0.5 mLs (50 Units total) into the skin in the morning and at bedtime., Disp: 90 mL, Rfl: 4 .  rosuvastatin (CRESTOR) 20 MG tablet, Take 1 tablet (20 mg total) by mouth daily., Disp: 90 tablet, Rfl: 3   No Known Allergies  ROS Review of Systems  Constitutional: Negative.   HENT: Negative.   Respiratory: Negative.   Cardiovascular: Negative.   Genitourinary: Negative.   Musculoskeletal: Negative.   Skin: Negative.   Neurological: Negative.      OBJECTIVE:    Physical Exam Vitals and nursing note reviewed.  Constitutional:      Appearance: Normal appearance.  HENT:     Head: Normocephalic.     Nose: Nose normal.     Mouth/Throat:     Mouth: Mucous membranes are moist.  Eyes:     Pupils: Pupils are equal, round, and reactive to light.  Cardiovascular:      Rate and Rhythm: Normal rate and regular rhythm.     Pulses: Normal pulses.  Musculoskeletal:        General: Normal range of motion.     Cervical back: Normal range of motion.  Skin:    Capillary Refill: Capillary refill takes less than 2 seconds.  Neurological:     Mental Status: She is alert and oriented to person, place, and time.  Psychiatric:        Mood and Affect: Mood normal.     BP (!) 139/91   Pulse (!) 104   Ht 5\' 6"  (1.676 m)   Wt 259 lb 8 oz (117.7 kg)   LMP 05/10/2015   BMI 41.88 kg/m  Wt Readings from Last 3 Encounters:  09/12/20 259 lb 8 oz (117.7 kg)  07/25/20 257 lb 12.8 oz (116.9 kg)  06/27/20 261 lb (118.4 kg)    Health Maintenance Due  Topic Date Due  . Hepatitis C Screening  Never done  . PNEUMOCOCCAL POLYSACCHARIDE VACCINE AGE 11-64 HIGH RISK  Never done  . COVID-19 Vaccine (1) Never done  . OPHTHALMOLOGY EXAM  Never done  . HIV Screening  Never done  . TETANUS/TDAP  Never done  . PAP SMEAR-Modifier  Never done  . INFLUENZA VACCINE  Never done    There are no preventive care reminders to display for this patient.  CBC Latest Ref Rng & Units 05/26/2020 04/23/2017 05/26/2015  WBC 4.0 - 10.5 K/uL 9.8 12.4(H) 11.6(H)  Hemoglobin 12.0 - 15.0 g/dL 12.4 14.1 12.6  Hematocrit 36.0 - 46.0 % 37.3 41.1 37.5  Platelets 150 - 400 K/uL 308 295 297   CMP Latest Ref Rng & Units 05/26/2020 04/23/2017 05/26/2015  Glucose 70 - 99 mg/dL 212(H) 403(H) 131(H)  BUN 6 - 20 mg/dL 11 16 14   Creatinine 0.44 - 1.00 mg/dL 0.58 0.93 0.45  Sodium 135 - 145 mmol/L 139 136 138  Potassium 3.5 - 5.1 mmol/L 4.0 3.9 3.7  Chloride 98 - 111 mmol/L 104 101 107  CO2 22 - 32 mmol/L 25 25 25   Calcium 8.9 - 10.3 mg/dL 9.1 9.4 8.9  Total Protein 6.5 - 8.1 g/dL - 7.5 -  Total Bilirubin 0.3 - 1.2 mg/dL - 0.3 -  Alkaline Phos 38 - 126 U/L - 65 -  AST 15 - 41 U/L - 20 -  ALT 14 - 54 U/L - 28 -    No results found for: TSH Lab Results  Component Value Date   ALBUMIN 4.5 04/23/2017    ANIONGAP 10  05/26/2020   Lab Results  Component Value Date   CHOL 130 04/20/2013   HDL 24 (L) 04/20/2013   LDLCALC 76 04/20/2013   Lab Results  Component Value Date   TRIG 150 04/20/2013   Lab Results  Component Value Date   HGBA1C 8.0 (H) 05/30/2020   HGBA1C 8.8 (H) 05/26/2015   HGBA1C 11.5 (H) 04/14/2015      ASSESSMENT & PLAN:   Problem List Items Addressed This Visit      Endocrine   Type 2 diabetes mellitus with hyperglycemia, with long-term current use of insulin ([facility]) - Primary    Glucose readings continue to improve with the ozempic, she is using the freestyle libre to read her glucose. Plan- Continue current meds and diet, A1C with next visit.      Relevant Medications   Continuous Blood Gluc Sensor (FREESTYLE LIBRE 2 SENSOR) MISC      Meds ordered this encounter  Medications  . Continuous Blood Gluc Sensor (FREESTYLE LIBRE 2 SENSOR) MISC    Sig: 1 Product by Does not apply route every 14 (fourteen) days.    Dispense:  1 each    Refill:  6      Follow-up: No follow-ups on file.    Beckie Salts, [place] [address] [place], [place] [address]

## 2020-10-10 NOTE — Assessment & Plan Note (Signed)
Glucose readings continue to improve with the ozempic, she is using the freestyle libre to read her glucose. Plan- Continue current meds and diet, A1C with next visit.

## 2020-10-21 ENCOUNTER — Other Ambulatory Visit: Payer: Self-pay | Admitting: *Deleted

## 2020-10-21 MED ORDER — LANTUS SOLOSTAR 100 UNIT/ML ~~LOC~~ SOPN: 60.0000 [IU] | PEN_INJECTOR | Freq: Two times a day (BID) | SUBCUTANEOUS | 6 refills | Status: DC

## 2020-10-24 ENCOUNTER — Other Ambulatory Visit: Payer: Self-pay | Admitting: Internal Medicine

## 2020-12-19 ENCOUNTER — Encounter: Payer: Self-pay | Admitting: Family Medicine

## 2020-12-19 ENCOUNTER — Other Ambulatory Visit: Payer: Self-pay

## 2020-12-19 ENCOUNTER — Ambulatory Visit (INDEPENDENT_AMBULATORY_CARE_PROVIDER_SITE_OTHER): Payer: Managed Care, Other (non HMO) | Admitting: Family Medicine

## 2020-12-19 VITALS — BP 125/83 | HR 96 | Ht 66.0 in | Wt 262.3 lb

## 2020-12-19 DIAGNOSIS — L719 Rosacea, unspecified: Secondary | ICD-10-CM | POA: Insufficient documentation

## 2020-12-19 DIAGNOSIS — E1165 Type 2 diabetes mellitus with hyperglycemia: Secondary | ICD-10-CM

## 2020-12-19 DIAGNOSIS — Z794 Long term (current) use of insulin: Secondary | ICD-10-CM | POA: Diagnosis not present

## 2020-12-19 LAB — POCT GLYCOSYLATED HEMOGLOBIN (HGB A1C): HbA1c POC (<> result, manual entry): 5.8 % (ref 4.0–5.6)

## 2020-12-19 NOTE — Assessment & Plan Note (Signed)
Patient fu for DM today, her A1C is 5.8 on Ozempic 0.5 mg weekly, Lantus 60 units bid, Metformin BID and Glipizide.   Plan Stop Metformin for now. Fu 3 months.

## 2020-12-19 NOTE — Addendum Note (Signed)
Addended by: Beckie Salts on: 12/19/2020 04:38 PM   Modules accepted: Orders

## 2020-12-19 NOTE — Progress Notes (Signed)
Established Patient Office Visit  SUBJECTIVE:  Subjective  Patient ID: Ariana Lindsey, female    DOB: 18-Mar-1981  Age: 40 y.o. MRN: [id number]  CC:  Chief Complaint  Patient presents with  . Diabetes    HPI Ariana Lindsey is a 40 y.o. female presenting today for     Past Medical History:  Diagnosis Date  . Diabetes mellitus without complication ([place])   . Hypertension     Past Surgical History:  Procedure Laterality Date  . CESAREAN SECTION     x 2  . LAPAROSCOPIC HYSTERECTOMY N/A 05/29/2015   Procedure: HYSTERECTOMY TOTAL LAPAROSCOPIC/BILATERAL SALPINGECTOMY;  Surgeon: Honor Loh Ward, MD;  Location: [facility] ORS;  Service: Gynecology;  Laterality: N/A;  . TUBAL LIGATION      Family History  Problem Relation Age of Onset  . Breast cancer Paternal Grandmother     Social History   Socioeconomic History  . Marital status: Single    Spouse name: Not on file  . Number of children: Not on file  . Years of education: Not on file  . Highest education level: Not on file  Occupational History  . Not on file  Tobacco Use  . Smoking status: Former Research scientist (life sciences)  . Smokeless tobacco: Never Used  Substance and Sexual Activity  . Alcohol use: Yes    Comment: rare  . Drug use: No  . Sexual activity: Not on file  Other Topics Concern  . Not on file  Social History Narrative  . Not on file   Social Determinants of Health   Financial Resource Strain: Not on file  Food Insecurity: Not on file  Transportation Needs: Not on file  Physical Activity: Not on file  Stress: Not on file  Social Connections: Not on file  Intimate Partner Violence: Not on file     Current Outpatient Medications:  .  BD PEN NEEDLE NANO 2ND GEN 32G X 4 MM MISC, USE TO INJECT INSULIN DAILY, Disp: 100 each, Rfl: 6 .  BD PEN NEEDLE NANO 2ND GEN 32G X 4 MM MISC, USE TO INJECT INSULIN DAILY, Disp: 100 each, Rfl: 6 .  Continuous Blood Gluc Sensor (FREESTYLE LIBRE 2 SENSOR) MISC, 1 Product by Does not  apply route every 14 (fourteen) days., Disp: 1 each, Rfl: 6 .  glipiZIDE (GLUCOTROL) 5 MG tablet, TAKE 1 TABLET BY MOUTH DAILY, Disp: 30 tablet, Rfl: 6 .  glipiZIDE (GLUCOTROL) 5 MG tablet, TAKE 1 TABLET BY MOUTH DAILY, Disp: 30 tablet, Rfl: 6 .  glucose blood test strip, Use as instructed, Disp: 100 each, Rfl: 12 .  insulin glargine (LANTUS SOLOSTAR) 100 UNIT/ML Solostar Pen, Inject 60 Units into the skin 2 (two) times daily., Disp: 54 mL, Rfl: 6 .  Insulin Syringe-Needle U-100 (INSULIN SYRINGE 1CC/31GX5/16") 31G X 5/16" 1 ML MISC, Inject 50 Units into the skin 2 (two) times daily., Disp: 100 each, Rfl: 0 .  LANTUS SOLOSTAR 100 UNIT/ML Solostar Pen, INJECT 60 UNITS UNDER THE SKIN EVERY DAY, Disp: 54 mL, Rfl: 6 .  losartan (COZAAR) 50 MG tablet, Take 1 tablet (50 mg total) by mouth daily., Disp: 90 tablet, Rfl: 3 .  Needles & Syringes MISC, Use to inject insulin daily, Disp: 110 each, Rfl: 0 .  Semaglutide,0.25 or 0.5MG /DOS, (OZEMPIC, 0.25 OR 0.5 MG/DOSE,) 2 MG/1.5ML SOPN, Inject 0.5 mg into the skin once a week., Disp: 1.5 mL, Rfl: 1 .  insulin glargine (LANTUS) 100 UNIT/ML injection, Inject 0.5 mLs (50 Units total) into the  skin in the morning and at bedtime., Disp: 90 mL, Rfl: 4 .  rosuvastatin (CRESTOR) 20 MG tablet, Take 1 tablet (20 mg total) by mouth daily., Disp: 90 tablet, Rfl: 3   No Known Allergies  ROS Review of Systems  Constitutional: Negative.   HENT: Negative.   Respiratory: Negative.   Cardiovascular: Negative.   Gastrointestinal: Negative.   Genitourinary: Negative.   Skin: Negative.   Psychiatric/Behavioral: Negative.      OBJECTIVE:    Physical Exam Vitals and nursing note reviewed.  Constitutional:      Appearance: Normal appearance.  HENT:     Right Ear: Tympanic membrane normal.     Left Ear: Tympanic membrane normal.     Mouth/Throat:     Mouth: Mucous membranes are moist.  Cardiovascular:     Rate and Rhythm: Normal rate and regular rhythm.   Musculoskeletal:        General: Normal range of motion.  Skin:    General: Skin is warm.  Neurological:     Mental Status: She is alert.  Psychiatric:        Mood and Affect: Mood normal.     BP 125/83   Pulse 96   Ht 5\' 6"  (1.676 m)   Wt 262 lb 4.8 oz (119 kg)   LMP 05/10/2015   BMI 42.34 kg/m  Wt Readings from Last 3 Encounters:  12/19/20 262 lb 4.8 oz (119 kg)  09/12/20 259 lb 8 oz (117.7 kg)  07/25/20 257 lb 12.8 oz (116.9 kg)    Health Maintenance Due  Topic Date Due  . PNEUMOCOCCAL POLYSACCHARIDE VACCINE AGE 24-64 HIGH RISK  Never done  . COVID-19 Vaccine (1) Never done  . OPHTHALMOLOGY EXAM  Never done  . HIV Screening  Never done  . Hepatitis C Screening  Never done  . TETANUS/TDAP  Never done  . PAP SMEAR-Modifier  Never done    There are no preventive care reminders to display for this patient.  CBC Latest Ref Rng & Units 09/10/2020 05/26/2020 04/23/2017  WBC 3.4 - 10.8 x10E3/uL 7.6 9.8 12.4(H)  Hemoglobin 11.1 - 15.9 g/dL 13.4 12.4 14.1  Hematocrit 34.0 - 46.6 % 39.9 37.3 41.1  Platelets 150 - 450 x10E3/uL 315 308 295   CMP Latest Ref Rng & Units 05/26/2020 04/23/2017 05/26/2015  Glucose 70 - 99 mg/dL 212(H) 403(H) 131(H)  BUN 6 - 20 mg/dL 11 16 14   Creatinine 0.44 - 1.00 mg/dL 0.58 0.93 0.45  Sodium 135 - 145 mmol/L 139 136 138  Potassium 3.5 - 5.1 mmol/L 4.0 3.9 3.7  Chloride 98 - 111 mmol/L 104 101 107  CO2 22 - 32 mmol/L 25 25 25   Calcium 8.9 - 10.3 mg/dL 9.1 9.4 8.9  Total Protein 6.5 - 8.1 g/dL - 7.5 -  Total Bilirubin 0.3 - 1.2 mg/dL - 0.3 -  Alkaline Phos 38 - 126 U/L - 65 -  AST 15 - 41 U/L - 20 -  ALT 14 - 54 U/L - 28 -    Lab Results  Component Value Date   TSH 1.280 09/10/2020   Lab Results  Component Value Date   ALBUMIN 4.5 04/23/2017   ANIONGAP 10 05/26/2020   Lab Results  Component Value Date   CHOL 116 09/10/2020   CHOL 130 04/20/2013   HDL 35 (L) 09/10/2020   HDL 24 (L) 04/20/2013   LDLCALC 60 09/10/2020   LDLCALC 76  04/20/2013   CHOLHDL 3.3 09/10/2020   Lab Results  Component Value Date   TRIG 116 09/10/2020   Lab Results  Component Value Date   HGBA1C 5.8 12/19/2020   HGBA1C 7.2 (H) 09/10/2020   HGBA1C 8.0 (H) 05/30/2020      ASSESSMENT & PLAN:   Problem List Items Addressed This Visit      Endocrine   Type 2 diabetes mellitus with hyperglycemia, with long-term current use of insulin ([place]) - Primary    Patient fu for DM today, her A1C is 5.8 on Ozempic 0.5 mg weekly, Lantus 60 units bid, Metformin BID and Glipizide.   Plan Stop Metformin for now. Fu 3 months.       Relevant Orders   POCT HgB A1C (Completed)      No orders of the defined types were placed in this encounter.     Follow-up: No follow-ups on file.    Beckie Salts, [place] [address] [place], [place] [address]

## 2020-12-19 NOTE — Assessment & Plan Note (Signed)
Is worsening, would like ref to derm

## 2020-12-22 ENCOUNTER — Other Ambulatory Visit: Payer: Self-pay | Admitting: Family Medicine

## 2020-12-22 DIAGNOSIS — E1165 Type 2 diabetes mellitus with hyperglycemia: Secondary | ICD-10-CM

## 2021-01-15 ENCOUNTER — Other Ambulatory Visit: Payer: Self-pay

## 2021-01-15 DIAGNOSIS — E1165 Type 2 diabetes mellitus with hyperglycemia: Secondary | ICD-10-CM

## 2021-01-15 DIAGNOSIS — Z794 Long term (current) use of insulin: Secondary | ICD-10-CM

## 2021-01-15 MED ORDER — OZEMPIC (1 MG/DOSE) 2 MG/1.5ML ~~LOC~~ SOPN: 1.0000 mg | PEN_INJECTOR | SUBCUTANEOUS | 1 refills | Status: AC

## 2021-01-22 ENCOUNTER — Other Ambulatory Visit: Payer: Self-pay | Admitting: Internal Medicine

## 2021-01-30 NOTE — Telephone Encounter (Signed)
c1

## 2021-03-20 ENCOUNTER — Ambulatory Visit: Payer: Managed Care, Other (non HMO) | Admitting: Family Medicine

## 2021-03-20 ENCOUNTER — Other Ambulatory Visit: Payer: Self-pay | Admitting: Internal Medicine

## 2021-03-20 DIAGNOSIS — I7090 Unspecified atherosclerosis: Secondary | ICD-10-CM

## 2021-03-31 ENCOUNTER — Other Ambulatory Visit: Payer: Self-pay

## 2021-03-31 ENCOUNTER — Ambulatory Visit: Payer: Managed Care, Other (non HMO) | Admitting: Internal Medicine

## 2021-03-31 ENCOUNTER — Encounter: Payer: Self-pay | Admitting: Internal Medicine

## 2021-03-31 VITALS — BP 142/82 | HR 98 | Ht 66.0 in | Wt 261.5 lb

## 2021-03-31 DIAGNOSIS — Z720 Tobacco use: Secondary | ICD-10-CM

## 2021-03-31 DIAGNOSIS — I10 Essential (primary) hypertension: Secondary | ICD-10-CM

## 2021-03-31 DIAGNOSIS — Z794 Long term (current) use of insulin: Secondary | ICD-10-CM

## 2021-03-31 DIAGNOSIS — E119 Type 2 diabetes mellitus without complications: Secondary | ICD-10-CM | POA: Diagnosis not present

## 2021-03-31 DIAGNOSIS — I7090 Unspecified atherosclerosis: Secondary | ICD-10-CM

## 2021-03-31 DIAGNOSIS — E1165 Type 2 diabetes mellitus with hyperglycemia: Secondary | ICD-10-CM | POA: Diagnosis not present

## 2021-03-31 DIAGNOSIS — R635 Abnormal weight gain: Secondary | ICD-10-CM

## 2021-03-31 LAB — POCT GLYCOSYLATED HEMOGLOBIN (HGB A1C): HbA1c POC (<> result, manual entry): 6.4 % (ref 4.0–5.6)

## 2021-03-31 NOTE — Assessment & Plan Note (Signed)
You have atherosclerosis  it Is the calcification of arteries of the heart noted on the chest CT scan.  plaque buildup in these areas is extremely common finding on the lung screening scans I would like to discuss this finding on the next visit. I suggest to follow low-cholesterol diet, walk on a daily basis, quit smoking if you are still smoking. We might suggest statin if he is agreeable with you. I recommend the patient to buy a book on the heart disease from from local bookstore also on [place] and read few pages every day that will give you motivation to follow your diet and exercise.,  Patient can use other resources like YouTube or Internet to learn more about the heart disease, this will supplement for some of the material I gave you from our office

## 2021-03-31 NOTE — Progress Notes (Signed)
Established Patient Office Visit  Subjective:  Patient ID: Ariana Lindsey, female    DOB: March 20, 1981  Age: 40 y.o. MRN: [id number]  CC:  Chief Complaint  Patient presents with   Diabetes    Patient has health screening form with her today that she needs completed. Patient's blood sugar was 113 1 hour ago. A1C being done today.     Diabetes Pertinent negatives for hypoglycemia include no seizures. Pertinent negatives for diabetes include no chest pain and no polyuria.   Ariana Lindsey presents for general check  , no chest pain or sob  Past Medical History:  Diagnosis Date   Diabetes mellitus without complication ([place])    Hypertension     Past Surgical History:  Procedure Laterality Date   CESAREAN SECTION     x 2   LAPAROSCOPIC HYSTERECTOMY N/A 05/29/2015   Procedure: HYSTERECTOMY TOTAL LAPAROSCOPIC/BILATERAL SALPINGECTOMY;  Surgeon: Honor Loh Ward, MD;  Location: [facility] ORS;  Service: Gynecology;  Laterality: N/A;   TUBAL LIGATION      Family History  Problem Relation Age of Onset   Breast cancer Paternal Grandmother     Social History   Socioeconomic History   Marital status: Single    Spouse name: Not on file   Number of children: Not on file   Years of education: Not on file   Highest education level: Not on file  Occupational History   Not on file  Tobacco Use   Smoking status: Former   Smokeless tobacco: Never  Substance and Sexual Activity   Alcohol use: Yes    Comment: rare   Drug use: No   Sexual activity: Not on file  Other Topics Concern   Not on file  Social History Narrative   Not on file   Social Determinants of Health   Financial Resource Strain: Not on file  Food Insecurity: Not on file  Transportation Needs: Not on file  Physical Activity: Not on file  Stress: Not on file  Social Connections: Not on file  Intimate Partner Violence: Not on file     Current Outpatient Medications:    BD PEN NEEDLE NANO 2ND GEN 32G X 4 MM MISC,  USE TO INJECT INSULIN DAILY, Disp: 100 each, Rfl: 6   BD PEN NEEDLE NANO 2ND GEN 32G X 4 MM MISC, USE TO INJECT INSULIN DAILY, Disp: 100 each, Rfl: 6   Continuous Blood Gluc Sensor (FREESTYLE LIBRE 2 SENSOR) MISC, 1 Product by Does not apply route every 14 (fourteen) days., Disp: 1 each, Rfl: 6   glipiZIDE (GLUCOTROL) 5 MG tablet, TAKE 1 TABLET BY MOUTH DAILY, Disp: 30 tablet, Rfl: 6   glipiZIDE (GLUCOTROL) 5 MG tablet, TAKE 1 TABLET BY MOUTH DAILY, Disp: 30 tablet, Rfl: 6   glucose blood test strip, Use as instructed, Disp: 100 each, Rfl: 12   insulin glargine (LANTUS SOLOSTAR) 100 UNIT/ML Solostar Pen, Inject 60 Units into the skin 2 (two) times daily., Disp: 54 mL, Rfl: 6   insulin glargine (LANTUS) 100 UNIT/ML injection, Inject 0.5 mLs (50 Units total) into the skin in the morning and at bedtime., Disp: 90 mL, Rfl: 4   Insulin Syringe-Needle U-100 (INSULIN SYRINGE 1CC/31GX5/16") 31G X 5/16" 1 ML MISC, Inject 50 Units into the skin 2 (two) times daily., Disp: 100 each, Rfl: 0   LANTUS SOLOSTAR 100 UNIT/ML Solostar Pen, INJECT 60 UNITS UNDER THE SKIN EVERY DAY, Disp: 54 mL, Rfl: 6   losartan (COZAAR) 50 MG tablet, TAKE  1 TABLET(50 MG) BY MOUTH DAILY, Disp: 90 tablet, Rfl: 3   metFORMIN (GLUCOPHAGE) 1000 MG tablet, TAKE 1 TABLET BY MOUTH TWICE DAILY, Disp: 180 tablet, Rfl: 3   Needles & Syringes MISC, Use to inject insulin daily, Disp: 110 each, Rfl: 0   rosuvastatin (CRESTOR) 20 MG tablet, TAKE 1 TABLET(20 MG) BY MOUTH DAILY, Disp: 90 tablet, Rfl: 3   Semaglutide, 1 MG/DOSE, (OZEMPIC, 1 MG/DOSE,) 2 MG/1.5ML SOPN, Inject 1 mg into the skin once a week., Disp: 9 mL, Rfl: 1   No Known Allergies  ROS Review of Systems  Constitutional: Negative.   HENT: Negative.    Eyes: Negative.   Respiratory: Negative.    Cardiovascular: Negative.  Negative for chest pain.  Gastrointestinal: Negative.  Negative for abdominal distention.  Endocrine: Negative.  Negative for polyuria.  Genitourinary:  Negative.  Negative for flank pain.  Musculoskeletal: Negative.  Negative for back pain and gait problem.  Skin: Negative.   Allergic/Immunologic: Negative.   Neurological: Negative.  Negative for seizures.  Hematological: Negative.   Psychiatric/Behavioral: Negative.  Negative for behavioral problems.   All other systems reviewed and are negative.    Objective:    Physical Exam Vitals reviewed.  Constitutional:      Appearance: Normal appearance.  HENT:     Mouth/Throat:     Mouth: Mucous membranes are moist.  Eyes:     Pupils: Pupils are equal, round, and reactive to light.  Neck:     Vascular: No carotid bruit.  Cardiovascular:     Rate and Rhythm: Normal rate and regular rhythm.     Pulses: Normal pulses.     Heart sounds: Normal heart sounds.  Pulmonary:     Effort: Pulmonary effort is normal.     Breath sounds: Normal breath sounds.  Abdominal:     General: Bowel sounds are normal.     Palpations: Abdomen is soft. There is no hepatomegaly, splenomegaly or mass.     Tenderness: There is no abdominal tenderness.     Hernia: No hernia is present.  Musculoskeletal:        General: No tenderness.     Cervical back: Neck supple.     Right lower leg: No edema.     Left lower leg: No edema.  Skin:    Findings: No rash.  Neurological:     Mental Status: She is alert and oriented to person, place, and time.     Motor: No weakness.  Psychiatric:        Mood and Affect: Mood and affect normal.        Behavior: Behavior normal.    BP (!) 142/82   Pulse 98   Ht '5\' 6"'$  (1.676 m)   Wt 261 lb 8 oz (118.6 kg)   LMP 05/10/2015   BMI 42.21 kg/m  Wt Readings from Last 3 Encounters:  03/31/21 261 lb 8 oz (118.6 kg)  12/19/20 262 lb 4.8 oz (119 kg)  09/12/20 259 lb 8 oz (117.7 kg)     Health Maintenance Due  Topic Date Due   PNEUMOCOCCAL POLYSACCHARIDE VACCINE AGE 60-64 HIGH RISK  Never done   COVID-19 Vaccine (1) Never done   OPHTHALMOLOGY EXAM  Never done   HIV  Screening  Never done   Hepatitis C Screening  Never done   TETANUS/TDAP  Never done   PAP SMEAR-Modifier  Never done   INFLUENZA VACCINE  03/16/2021    There are no preventive care reminders to display  for this patient.  Lab Results  Component Value Date   TSH 1.280 09/10/2020   Lab Results  Component Value Date   WBC 7.6 09/10/2020   HGB 13.4 09/10/2020   HCT 39.9 09/10/2020   MCV 87 09/10/2020   PLT 315 09/10/2020   Lab Results  Component Value Date   NA 139 05/26/2020   K 4.0 05/26/2020   CO2 25 05/26/2020   GLUCOSE 212 (H) 05/26/2020   BUN 11 05/26/2020   CREATININE 0.58 05/26/2020   BILITOT 0.3 04/23/2017   ALKPHOS 65 04/23/2017   AST 20 04/23/2017   ALT 28 04/23/2017   PROT 7.5 04/23/2017   ALBUMIN 4.5 04/23/2017   CALCIUM 9.1 05/26/2020   ANIONGAP 10 05/26/2020   Lab Results  Component Value Date   CHOL 116 09/10/2020   Lab Results  Component Value Date   HDL 35 (L) 09/10/2020   Lab Results  Component Value Date   LDLCALC 60 09/10/2020   Lab Results  Component Value Date   TRIG 116 09/10/2020   Lab Results  Component Value Date   CHOLHDL 3.3 09/10/2020   Lab Results  Component Value Date   HGBA1C 6.4 03/31/2021      Assessment & Plan:   Problem List Items Addressed This Visit       Cardiovascular and Mediastinum   Atherosclerosis    You have atherosclerosis  it Is the calcification of arteries of the heart noted on the chest CT scan.  plaque buildup in these areas is extremely common finding on the lung screening scans I would like to discuss this finding on the next visit. I suggest to follow low-cholesterol diet, walk on a daily basis, quit smoking if you are still smoking. We might suggest statin if he is agreeable with you. I recommend the patient to buy a book on the heart disease from from local bookstore also on [place] and read few pages every day that will give you motivation to follow your diet and exercise.,  Patient can  use other resources like YouTube or Internet to learn more about the heart disease, this will supplement for some of the material I gave you from our office      Hypertension     Patient denies any chest pain or shortness of breath there is no history of palpitation or paroxysmal nocturnal dyspnea   patient was advised to follow low-salt low-cholesterol diet    ideally I want to keep systolic blood pressure below 130 mmHg, patient was asked to check blood pressure one times a week and give me a report on that.  Patient will be follow-up in 3 months  or earlier as needed, patient will call me back for any change in the cardiovascular symptoms Patient was advised to buy a book from local bookstore concerning blood pressure and read several chapters  every day.  This will be supplemented by some of the material we will give him from the office.  Patient should also utilize other resources like YouTube and Internet to learn more about the blood pressure and the diet.        Endocrine   Type 2 diabetes mellitus with hyperglycemia, with long-term current use of insulin ([place])    - The patient's blood sugar is labile on med. - The patient will continue the current treatment regimen.  - I encouraged the patient to regularly check blood sugar.  - I encouraged the patient to monitor diet. I encouraged the patient  to eat low-carb and low-sugar to help prevent blood sugar spikes.  - I encouraged the patient to continue following their prescribed treatment plan for diabetes - I informed the patient to get help if blood sugar drops below '54mg'$ /dL, or if suddenly have trouble thinking clearly or breathing.  Patient was advised to buy a book on diabetes from a local bookstore or from [place]  Patient should read 2 chapters every day to keep the motivation going, this is in addition to some of the materials we provided them from the office.  There are other resources on the Internet like YouTube and wilkipedia to  get an education on the diabetes        Other   Weight gain    - I encouraged the patient to lose weight.  - I educated them on making healthy dietary choices including eating more fruits and vegetables and less fried foods. - I encouraged the patient to exercise more, and educated on the benefits of exercise including weight loss, diabetes prevention, and hypertension prevention.   Dietary counseling with a registered dietician  Referral to a weight management support group (e.g. Weight Watchers, Overeaters Anonymous)  If your BMI is greater than 29 or you have gained more than 15 pounds you should work on weight loss.  Attend a healthy cooking class       Tobacco abuse    Patient already quit smoking      Other Visit Diagnoses     Type 2 diabetes mellitus without complication, unspecified whether long term insulin use ([place])    -  Primary   Relevant Orders   POCT HgB A1C (Completed)       No orders of the defined types were placed in this encounter.   Follow-up: No follow-ups on file.    Cletis Athens, MD

## 2021-03-31 NOTE — Assessment & Plan Note (Signed)

## 2021-03-31 NOTE — Assessment & Plan Note (Signed)

## 2021-03-31 NOTE — Assessment & Plan Note (Signed)

## 2021-03-31 NOTE — Assessment & Plan Note (Signed)
Patient already quit smoking

## 2021-05-04 ENCOUNTER — Encounter: Payer: Self-pay | Admitting: Internal Medicine

## 2021-05-04 ENCOUNTER — Other Ambulatory Visit: Payer: Self-pay

## 2021-05-04 ENCOUNTER — Ambulatory Visit: Payer: Managed Care, Other (non HMO) | Admitting: Internal Medicine

## 2021-05-04 VITALS — BP 121/70 | HR 101 | Temp 99.1°F | Ht 66.0 in | Wt 264.8 lb

## 2021-05-04 DIAGNOSIS — Z794 Long term (current) use of insulin: Secondary | ICD-10-CM

## 2021-05-04 DIAGNOSIS — I10 Essential (primary) hypertension: Secondary | ICD-10-CM | POA: Diagnosis not present

## 2021-05-04 DIAGNOSIS — R635 Abnormal weight gain: Secondary | ICD-10-CM | POA: Diagnosis not present

## 2021-05-04 DIAGNOSIS — L719 Rosacea, unspecified: Secondary | ICD-10-CM | POA: Diagnosis not present

## 2021-05-04 DIAGNOSIS — J029 Acute pharyngitis, unspecified: Secondary | ICD-10-CM | POA: Insufficient documentation

## 2021-05-04 DIAGNOSIS — E1165 Type 2 diabetes mellitus with hyperglycemia: Secondary | ICD-10-CM

## 2021-05-04 DIAGNOSIS — Z720 Tobacco use: Secondary | ICD-10-CM

## 2021-05-04 MED ORDER — GLIPIZIDE 5 MG PO TABS: 5.0000 mg | ORAL_TABLET | Freq: Every day | ORAL | 3 refills | Status: DC

## 2021-05-04 MED ORDER — AZITHROMYCIN 250 MG PO TABS
ORAL_TABLET | ORAL | 0 refills | Status: AC
Start: 2021-05-04 — End: 2021-05-09

## 2021-05-04 NOTE — Progress Notes (Signed)
Established Patient Office Visit  Subjective:  Patient ID: Ariana Lindsey, female    DOB: April 12, 1981  Age: 40 y.o. MRN: [id number]  CC:  Chief Complaint  Patient presents with   Nasal Congestion    Patient complains of sore throat, headache, nasal congestion, ears are stopped up. Symptoms started at 10:00 am 05/04/2021    HPI  Ariana Lindsey presents for sore throat  Past Medical History:  Diagnosis Date   Diabetes mellitus without complication ([place])    Hypertension     Past Surgical History:  Procedure Laterality Date   CESAREAN SECTION     x 2   LAPAROSCOPIC HYSTERECTOMY N/A 05/29/2015   Procedure: HYSTERECTOMY TOTAL LAPAROSCOPIC/BILATERAL SALPINGECTOMY;  Surgeon: Ariana Loh Ward, MD;  Location: [facility] ORS;  Service: Gynecology;  Laterality: N/A;   TUBAL LIGATION      Family History  Problem Relation Age of Onset   Breast cancer Paternal Grandmother     Social History   Socioeconomic History   Marital status: Single    Spouse name: Not on file   Number of children: Not on file   Years of education: Not on file   Highest education level: Not on file  Occupational History   Not on file  Tobacco Use   Smoking status: Former   Smokeless tobacco: Never  Substance and Sexual Activity   Alcohol use: Yes    Comment: rare   Drug use: No   Sexual activity: Not on file  Other Topics Concern   Not on file  Social History Narrative   Not on file   Social Determinants of Health   Financial Resource Strain: Not on file  Food Insecurity: Not on file  Transportation Needs: Not on file  Physical Activity: Not on file  Stress: Not on file  Social Connections: Not on file  Intimate Partner Violence: Not on file     Current Outpatient Medications:    azithromycin (ZITHROMAX) 250 MG tablet, Take 2 tablets on day 1, then 1 tablet daily on days 2 through 5, Disp: 6 tablet, Rfl: 0   BD PEN NEEDLE NANO 2ND GEN 32G X 4 MM MISC, USE TO INJECT INSULIN DAILY, Disp: 100  each, Rfl: 6   BD PEN NEEDLE NANO 2ND GEN 32G X 4 MM MISC, USE TO INJECT INSULIN DAILY, Disp: 100 each, Rfl: 6   Continuous Blood Gluc Sensor (FREESTYLE LIBRE 2 SENSOR) MISC, 1 Product by Does not apply route every 14 (fourteen) days., Disp: 1 each, Rfl: 6   glipiZIDE (GLUCOTROL) 5 MG tablet, TAKE 1 TABLET BY MOUTH DAILY, Disp: 30 tablet, Rfl: 6   glipiZIDE (GLUCOTROL) 5 MG tablet, TAKE 1 TABLET BY MOUTH DAILY, Disp: 30 tablet, Rfl: 6   glucose blood test strip, Use as instructed, Disp: 100 each, Rfl: 12   insulin glargine (LANTUS SOLOSTAR) 100 UNIT/ML Solostar Pen, Inject 60 Units into the skin 2 (two) times daily., Disp: 54 mL, Rfl: 6   insulin glargine (LANTUS) 100 UNIT/ML injection, Inject 0.5 mLs (50 Units total) into the skin in the morning and at bedtime., Disp: 90 mL, Rfl: 4   Insulin Syringe-Needle U-100 (INSULIN SYRINGE 1CC/31GX5/16") 31G X 5/16" 1 ML MISC, Inject 50 Units into the skin 2 (two) times daily., Disp: 100 each, Rfl: 0   LANTUS SOLOSTAR 100 UNIT/ML Solostar Pen, INJECT 60 UNITS UNDER THE SKIN EVERY DAY, Disp: 54 mL, Rfl: 6   losartan (COZAAR) 50 MG tablet, TAKE 1 TABLET(50 MG) BY MOUTH  DAILY, Disp: 90 tablet, Rfl: 3   metFORMIN (GLUCOPHAGE) 1000 MG tablet, TAKE 1 TABLET BY MOUTH TWICE DAILY, Disp: 180 tablet, Rfl: 3   Needles & Syringes MISC, Use to inject insulin daily, Disp: 110 each, Rfl: 0   rosuvastatin (CRESTOR) 20 MG tablet, TAKE 1 TABLET(20 MG) BY MOUTH DAILY, Disp: 90 tablet, Rfl: 3   No Known Allergies  ROS Review of Systems  Constitutional: Negative.  Negative for chills, fatigue and fever.  HENT:  Positive for congestion and sore throat.   Eyes: Negative.   Respiratory: Negative.    Cardiovascular: Negative.   Gastrointestinal: Negative.   Endocrine: Negative.   Genitourinary: Negative.   Musculoskeletal: Negative.   Skin: Negative.   Allergic/Immunologic: Negative.   Neurological: Negative.   Hematological: Negative.   Psychiatric/Behavioral:  Negative.    All other systems reviewed and are negative.    Objective:    Physical Exam Vitals reviewed.  Constitutional:      Appearance: Normal appearance.  HENT:     Nose: Congestion present.     Mouth/Throat:     Mouth: Mucous membranes are moist.     Pharynx: Oropharyngeal exudate present.  Eyes:     Conjunctiva/sclera: Conjunctivae normal.     Pupils: Pupils are equal, round, and reactive to light.  Neck:     Vascular: No carotid bruit.  Cardiovascular:     Rate and Rhythm: Normal rate and regular rhythm.     Pulses: Normal pulses.     Heart sounds: Normal heart sounds.  Pulmonary:     Effort: Pulmonary effort is normal.     Breath sounds: Normal breath sounds.  Abdominal:     General: Bowel sounds are normal.     Palpations: Abdomen is soft. There is no hepatomegaly, splenomegaly or mass.     Tenderness: There is no abdominal tenderness.     Hernia: No hernia is present.  Musculoskeletal:        General: No tenderness.     Cervical back: Normal range of motion and neck supple.     Right lower leg: No edema.     Left lower leg: No edema.  Skin:    Findings: No rash.  Neurological:     Mental Status: She is alert and oriented to person, place, and time.     Motor: No weakness.  Psychiatric:        Mood and Affect: Mood and affect normal.        Behavior: Behavior normal.    BP 121/70   Pulse (!) 101   Temp 99.1 F (37.3 C)   Ht '5\' 6"'$  (1.676 m)   Wt 264 lb 12.8 oz (120.1 kg)   LMP 05/10/2015   BMI 42.74 kg/m  Wt Readings from Last 3 Encounters:  05/04/21 264 lb 12.8 oz (120.1 kg)  03/31/21 261 lb 8 oz (118.6 kg)  12/19/20 262 lb 4.8 oz (119 kg)     Health Maintenance Due  Topic Date Due   COVID-19 Vaccine (1) Never done   OPHTHALMOLOGY EXAM  Never done   HIV Screening  Never done   Hepatitis C Screening  Never done   TETANUS/TDAP  Never done   PAP SMEAR-Modifier  Never done   INFLUENZA VACCINE  Never done   FOOT EXAM  04/03/2021    There  are no preventive care reminders to display for this patient.  Lab Results  Component Value Date   TSH 1.280 09/10/2020   Lab Results  Component Value Date   WBC 7.6 09/10/2020   HGB 13.4 09/10/2020   HCT 39.9 09/10/2020   MCV 87 09/10/2020   PLT 315 09/10/2020   Lab Results  Component Value Date   NA 139 05/26/2020   K 4.0 05/26/2020   CO2 25 05/26/2020   GLUCOSE 212 (H) 05/26/2020   BUN 11 05/26/2020   CREATININE 0.58 05/26/2020   BILITOT 0.3 04/23/2017   ALKPHOS 65 04/23/2017   AST 20 04/23/2017   ALT 28 04/23/2017   PROT 7.5 04/23/2017   ALBUMIN 4.5 04/23/2017   CALCIUM 9.1 05/26/2020   ANIONGAP 10 05/26/2020   Lab Results  Component Value Date   CHOL 116 09/10/2020   Lab Results  Component Value Date   HDL 35 (L) 09/10/2020   Lab Results  Component Value Date   LDLCALC 60 09/10/2020   Lab Results  Component Value Date   TRIG 116 09/10/2020   Lab Results  Component Value Date   CHOLHDL 3.3 09/10/2020   Lab Results  Component Value Date   HGBA1C 6.4 03/31/2021      Assessment & Plan:   Problem List Items Addressed This Visit       Cardiovascular and Mediastinum   Hypertension - Primary     Patient denies any chest pain or shortness of breath there is no history of palpitation or paroxysmal nocturnal dyspnea   patient was advised to follow low-salt low-cholesterol diet    ideally I want to keep systolic blood pressure below 130 mmHg, patient was asked to check blood pressure one times a week and give me a report on that.  Patient will be follow-up in 3 months  or earlier as needed, patient will call me back for any change in the cardiovascular symptoms Patient was advised to buy a book from local bookstore concerning blood pressure and read several chapters  every day.  This will be supplemented by some of the material we will give him from the office.  Patient should also utilize other resources like YouTube and Internet to learn more about the  blood pressure and the diet.        Respiratory   Pharyngitis    We will start on Z-Pak and Claritin 10 mg daily      Relevant Medications   azithromycin (ZITHROMAX) 250 MG tablet     Endocrine   Type 2 diabetes mellitus with hyperglycemia, with long-term current use of insulin ([place])    - The patient's blood sugar is labile on med. - The patient will continue the current treatment regimen.  - I encouraged the patient to regularly check blood sugar.  - I encouraged the patient to monitor diet. I encouraged the patient to eat low-carb and low-sugar to help prevent blood sugar spikes.  - I encouraged the patient to continue following their prescribed treatment plan for diabetes - I informed the patient to get help if blood sugar drops below '54mg'$ /dL, or if suddenly have trouble thinking clearly or breathing.  Patient was advised to buy a book on diabetes from a local bookstore or from [place]  Patient should read 2 chapters every day to keep the motivation going, this is in addition to some of the materials we provided them from the office.  There are other resources on the Internet like YouTube and wilkipedia to get an education on the diabetes        Musculoskeletal and Integument   Rosacea     Other  Weight gain    Patient was advised to lose weight.      Tobacco abuse    Patient does not smoke does not dip snuff       Meds ordered this encounter  Medications   azithromycin (ZITHROMAX) 250 MG tablet    Sig: Take 2 tablets on day 1, then 1 tablet daily on days 2 through 5    Dispense:  6 tablet    Refill:  0    Follow-up: No follow-ups on file.    Cletis Athens, MD

## 2021-05-04 NOTE — Assessment & Plan Note (Signed)

## 2021-05-04 NOTE — Assessment & Plan Note (Signed)
Patient does not smoke does not dip snuff

## 2021-05-04 NOTE — Assessment & Plan Note (Signed)

## 2021-05-04 NOTE — Assessment & Plan Note (Signed)
We will start on Z-Pak and Claritin 10 mg daily

## 2021-05-04 NOTE — Assessment & Plan Note (Signed)
Patient was advised to lose weight 

## 2021-05-25 ENCOUNTER — Ambulatory Visit: Payer: Managed Care, Other (non HMO) | Admitting: Dermatology

## 2021-06-29 ENCOUNTER — Ambulatory Visit: Payer: Managed Care, Other (non HMO) | Admitting: Internal Medicine

## 2021-07-20 ENCOUNTER — Other Ambulatory Visit: Payer: Self-pay | Admitting: *Deleted

## 2021-07-20 ENCOUNTER — Other Ambulatory Visit: Payer: Self-pay

## 2021-07-20 MED ORDER — BD PEN NEEDLE NANO 2ND GEN 32G X 4 MM MISC: 6 refills | Status: AC

## 2021-07-20 MED ORDER — "INSULIN SYRINGE 31G X 5/16"" 1 ML MISC": 50.0000 [IU] | Freq: Two times a day (BID) | 2 refills | Status: DC

## 2021-07-20 MED ORDER — LANTUS SOLOSTAR 100 UNIT/ML ~~LOC~~ SOPN: PEN_INJECTOR | SUBCUTANEOUS | 6 refills | Status: DC

## 2021-08-27 ENCOUNTER — Ambulatory Visit: Payer: Managed Care, Other (non HMO) | Admitting: Dermatology

## 2021-08-31 ENCOUNTER — Other Ambulatory Visit: Payer: Self-pay | Admitting: *Deleted

## 2021-08-31 MED ORDER — LANTUS SOLOSTAR 100 UNIT/ML ~~LOC~~ SOPN: 60.0000 [IU] | PEN_INJECTOR | Freq: Two times a day (BID) | SUBCUTANEOUS | 6 refills | Status: AC

## 2021-08-31 MED ORDER — SEMAGLUTIDE (1 MG/DOSE) 4 MG/3ML ~~LOC~~ SOPN: 1.0000 mg | PEN_INJECTOR | SUBCUTANEOUS | 6 refills | Status: DC

## 2021-11-30 ENCOUNTER — Ambulatory Visit: Payer: Managed Care, Other (non HMO) | Admitting: Internal Medicine

## 2021-12-01 ENCOUNTER — Ambulatory Visit: Payer: Managed Care, Other (non HMO) | Admitting: Internal Medicine

## 2021-12-03 ENCOUNTER — Encounter: Payer: Self-pay | Admitting: Nurse Practitioner

## 2021-12-03 ENCOUNTER — Ambulatory Visit (INDEPENDENT_AMBULATORY_CARE_PROVIDER_SITE_OTHER): Payer: Managed Care, Other (non HMO) | Admitting: Nurse Practitioner

## 2021-12-03 VITALS — BP 135/85 | HR 93 | Ht 66.0 in | Wt 264.5 lb

## 2021-12-03 DIAGNOSIS — Z794 Long term (current) use of insulin: Secondary | ICD-10-CM | POA: Diagnosis not present

## 2021-12-03 DIAGNOSIS — E1165 Type 2 diabetes mellitus with hyperglycemia: Secondary | ICD-10-CM

## 2021-12-03 DIAGNOSIS — H9203 Otalgia, bilateral: Secondary | ICD-10-CM | POA: Diagnosis not present

## 2021-12-03 DIAGNOSIS — Z6841 Body Mass Index (BMI) 40.0 and over, adult: Secondary | ICD-10-CM

## 2021-12-03 LAB — GLUCOSE, POCT (MANUAL RESULT ENTRY): POC Glucose: 128 mg/dl — AB (ref 70–99)

## 2021-12-03 MED ORDER — ACETIC ACID 2 % OT SOLN: 4.0000 [drp] | Freq: Four times a day (QID) | OTIC | 0 refills | Status: DC

## 2021-12-03 NOTE — Progress Notes (Signed)
? ?Established Patient Office Visit ? ?Subjective:  ?Patient ID: Ariana Lindsey, female    DOB: 1981-07-11  Age: 41 y.o. MRN: [id number] ? ?CC:  ?Chief Complaint  ?Patient presents with  ? Diabetes  ? ? ? ?HPI ? ?Ariana Lindsey presents for diabetes follow up. Her BS at home range between 120 -135. She  also complaints of ear pain. ? ?HPI  ? ?Past Medical History:  ?Diagnosis Date  ? Diabetes mellitus without complication ([place])   ? Hypertension   ? ? ?Past Surgical History:  ?Procedure Laterality Date  ? CESAREAN SECTION    ? x 2  ? LAPAROSCOPIC HYSTERECTOMY N/A 05/29/2015  ? Procedure: HYSTERECTOMY TOTAL LAPAROSCOPIC/BILATERAL SALPINGECTOMY;  Surgeon: Honor Loh Ward, MD;  Location: [facility] ORS;  Service: Gynecology;  Laterality: N/A;  ? TUBAL LIGATION    ? ? ?Family History  ?Problem Relation Age of Onset  ? Breast cancer Paternal Grandmother   ? ? ?Social History  ? ?Socioeconomic History  ? Marital status: Single  ?  Spouse name: Not on file  ? Number of children: Not on file  ? Years of education: Not on file  ? Highest education level: Not on file  ?Occupational History  ? Not on file  ?Tobacco Use  ? Smoking status: Former  ? Smokeless tobacco: Never  ?Substance and Sexual Activity  ? Alcohol use: Yes  ?  Comment: rare  ? Drug use: No  ? Sexual activity: Not on file  ?Other Topics Concern  ? Not on file  ?Social History Narrative  ? Not on file  ? ?Social Determinants of Health  ? ?Financial Resource Strain: Not on file  ?Food Insecurity: Not on file  ?Transportation Needs: Not on file  ?Physical Activity: Not on file  ?Stress: Not on file  ?Social Connections: Not on file  ?Intimate Partner Violence: Not on file  ? ? ? ?Outpatient Medications Prior to Visit  ?Medication Sig Dispense Refill  ? Continuous Blood Gluc Sensor (FREESTYLE LIBRE 2 SENSOR) MISC 1 Product by Does not apply route every 14 (fourteen) days. 1 each 6  ? glipiZIDE (GLUCOTROL) 5 MG tablet TAKE 1 TABLET BY MOUTH DAILY 30 tablet 6  ? glipiZIDE  (GLUCOTROL) 5 MG tablet Take 1 tablet (5 mg total) by mouth daily. 90 tablet 3  ? glucose blood test strip Use as instructed 100 each 12  ? insulin glargine (LANTUS SOLOSTAR) 100 UNIT/ML Solostar Pen INJECT 60 UNITS UNDER THE SKIN EVERY DAY 60 mL 6  ? insulin glargine (LANTUS SOLOSTAR) 100 UNIT/ML Solostar Pen Inject 60 Units into the skin 2 (two) times daily. 3 mL 6  ? Insulin Pen Needle (BD PEN NEEDLE NANO 2ND GEN) 32G X 4 MM MISC USE TO INJECT INSULIN BID 100 each 6  ? Insulin Syringe-Needle U-100 (INSULIN SYRINGE 1CC/31GX5/16") 31G X 5/16" 1 ML MISC Inject 50 Units into the skin 2 (two) times daily. 100 each 2  ? losartan (COZAAR) 50 MG tablet TAKE 1 TABLET(50 MG) BY MOUTH DAILY 90 tablet 3  ? metFORMIN (GLUCOPHAGE) 1000 MG tablet TAKE 1 TABLET BY MOUTH TWICE DAILY 180 tablet 3  ? Needles & Syringes MISC Use to inject insulin daily 110 each 0  ? rosuvastatin (CRESTOR) 20 MG tablet TAKE 1 TABLET(20 MG) BY MOUTH DAILY 90 tablet 3  ? Semaglutide, 1 MG/DOSE, 4 MG/3ML SOPN Inject 1 mg as directed once a week. 3 mL 6  ? insulin glargine (LANTUS) 100 UNIT/ML injection Inject 0.5 mLs (  50 Units total) into the skin in the morning and at bedtime. 90 mL 4  ? ?No facility-administered medications prior to visit.  ? ? ?No Known Allergies ? ?ROS ?Review of Systems  ?Constitutional:  Negative for activity change and fatigue.  ?HENT:  Positive for ear pain. Negative for congestion and postnasal drip.   ?Eyes:  Negative for discharge and redness.  ?Respiratory:  Negative for chest tightness and shortness of breath.   ?Cardiovascular:  Negative for chest pain and palpitations.  ?Gastrointestinal:  Negative for abdominal pain and constipation.  ?Genitourinary:  Negative for difficulty urinating and frequency.  ?Musculoskeletal:  Negative for arthralgias and joint swelling.  ?Skin:  Negative for color change and pallor.  ?Neurological:  Negative for dizziness, facial asymmetry and headaches.  ?Psychiatric/Behavioral:  Negative for  agitation, behavioral problems, confusion and suicidal ideas.   ? ?  ?Objective:  ?  ?Physical Exam ?Constitutional:   ?   Appearance: Normal appearance. She is obese.  ?HENT:  ?   Head: Normocephalic.  ?   Right Ear: Tympanic membrane is erythematous.  ?   Left Ear: Tympanic membrane is erythematous.  ?   Nose: Nose normal.  ?   Mouth/Throat:  ?   Mouth: Mucous membranes are moist.  ?   Pharynx: Oropharynx is clear.  ?Eyes:  ?   Extraocular Movements: Extraocular movements intact.  ?   Conjunctiva/sclera: Conjunctivae normal.  ?   Pupils: Pupils are equal, round, and reactive to light.  ?Cardiovascular:  ?   Rate and Rhythm: Normal rate and regular rhythm.  ?   Pulses: Normal pulses.  ?   Heart sounds: Normal heart sounds.  ?Pulmonary:  ?   Effort: Pulmonary effort is normal.  ?   Breath sounds: Normal breath sounds.  ?Abdominal:  ?   General: Bowel sounds are normal.  ?   Palpations: Abdomen is soft.  ?Musculoskeletal:     ?   General: Normal range of motion.  ?   Cervical back: Normal range of motion.  ?Skin: ?   General: Skin is warm.  ?Neurological:  ?   General: No focal deficit present.  ?   Mental Status: She is alert and oriented to person, place, and time. Mental status is at baseline.  ?Psychiatric:     ?   Mood and Affect: Mood normal.     ?   Behavior: Behavior normal.     ?   Thought Content: Thought content normal.     ?   Judgment: Judgment normal.  ? ? ?BP 135/85   Pulse 93   Ht '5\' 6"'$  (1.676 m)   Wt 264 lb 8 oz (120 kg)   LMP 05/10/2015   BMI 42.69 kg/m?  ?Wt Readings from Last 3 Encounters:  ?12/03/21 264 lb 8 oz (120 kg)  ?05/04/21 264 lb 12.8 oz (120.1 kg)  ?03/31/21 261 lb 8 oz (118.6 kg)  ? ? ? ?Health Maintenance Due  ?Topic Date Due  ? COVID-19 Vaccine (1) Never done  ? OPHTHALMOLOGY EXAM  Never done  ? HIV Screening  Never done  ? Hepatitis C Screening  Never done  ? TETANUS/TDAP  Never done  ? PAP SMEAR-Modifier  Never done  ? FOOT EXAM  04/03/2021  ? HEMOGLOBIN A1C  10/01/2021   ? ? ?There are no preventive care reminders to display for this patient. ? ?Lab Results  ?Component Value Date  ? TSH 1.280 09/10/2020  ? ?Lab Results  ?Component Value  Date  ? WBC 7.6 09/10/2020  ? HGB 13.4 09/10/2020  ? HCT 39.9 09/10/2020  ? MCV 87 09/10/2020  ? PLT 315 09/10/2020  ? ?Lab Results  ?Component Value Date  ? NA 139 05/26/2020  ? K 4.0 05/26/2020  ? CO2 25 05/26/2020  ? GLUCOSE 212 (H) 05/26/2020  ? BUN 11 05/26/2020  ? CREATININE 0.58 05/26/2020  ? BILITOT 0.3 04/23/2017  ? ALKPHOS 65 04/23/2017  ? AST 20 04/23/2017  ? ALT 28 04/23/2017  ? PROT 7.5 04/23/2017  ? ALBUMIN 4.5 04/23/2017  ? CALCIUM 9.1 05/26/2020  ? ANIONGAP 10 05/26/2020  ? ?Lab Results  ?Component Value Date  ? CHOL 116 09/10/2020  ? ?Lab Results  ?Component Value Date  ? HDL 35 (L) 09/10/2020  ? ?Lab Results  ?Component Value Date  ? [place] 60 09/10/2020  ? ?Lab Results  ?Component Value Date  ? TRIG 116 09/10/2020  ? ?Lab Results  ?Component Value Date  ? CHOLHDL 3.3 09/10/2020  ? ?Lab Results  ?Component Value Date  ? HGBA1C 6.4 03/31/2021  ? ? ?  ?Assessment & Plan:  ? ?Problem List Items Addressed This Visit   ? ?  ? Endocrine  ? Type 2 diabetes mellitus with hyperglycemia, with long-term current use of insulin (HCC) - Primary  ?  Patient BS 127 in the office today. ?Advised pt to check the BS regularly, make a log and bring to next appointment.  ?Advised pt to monitor diet. ?Advised pt to eat variety of food including fruits, vegetables, whole grains, complex carbohydrates and proteins.  ? ?  ?  ? Relevant Orders  ? POCT glucose (manual entry) (Completed)  ?  ? Other  ? Ear pain, bilateral  ?  Started on acetic acid drops. ? ?  ?  ? Class 3 severe obesity with serious comorbidity and body mass index (BMI) of 40.0 to 44.9 in adult [facility])  ?  BMI 42.69 ?Advised pt to lose weight. ?Advised patient to avoid trans fat, fatty and fried food. ?Follow a regular physical activity schedule. ?Went over the risk of chronic diseases with  increased weight.   ? ? ? ?  ?  ? ? ? ?Meds ordered this encounter  ?Medications  ? acetic acid 2 % otic solution  ?  Sig: Place 4 drops into the left ear 4 (four) times daily.  ?  Dispense:  15 mL  ?  Refill:  0

## 2021-12-04 ENCOUNTER — Other Ambulatory Visit: Payer: Self-pay | Admitting: *Deleted

## 2021-12-04 MED ORDER — GLUCOSE BLOOD VI STRP: ORAL_STRIP | 12 refills | Status: DC

## 2021-12-09 DIAGNOSIS — H9203 Otalgia, bilateral: Secondary | ICD-10-CM | POA: Insufficient documentation

## 2021-12-09 NOTE — Assessment & Plan Note (Signed)
Started on acetic acid drops. ?

## 2021-12-09 NOTE — Assessment & Plan Note (Signed)
BMI 42.69 ?Advised pt to lose weight. ?Advised patient to avoid trans fat, fatty and fried food. ?Follow a regular physical activity schedule. ?Went over the risk of chronic diseases with increased weight.   ? ? ? ?

## 2021-12-09 NOTE — Assessment & Plan Note (Signed)
Patient BS 127 in the office today. ?Advised pt to check the BS regularly, make a log and bring to next appointment.  ?Advised pt to monitor diet. ?Advised pt to eat variety of food including fruits, vegetables, whole grains, complex carbohydrates and proteins.  ? ?

## 2021-12-21 ENCOUNTER — Other Ambulatory Visit: Payer: Self-pay | Admitting: *Deleted

## 2021-12-21 DIAGNOSIS — E1165 Type 2 diabetes mellitus with hyperglycemia: Secondary | ICD-10-CM

## 2021-12-21 MED ORDER — FREESTYLE LIBRE 2 SENSOR MISC
1.0000 | 6 refills | Status: AC
Start: 2021-12-21 — End: ?

## 2022-02-12 ENCOUNTER — Encounter: Payer: Self-pay | Admitting: Nurse Practitioner

## 2022-02-12 ENCOUNTER — Ambulatory Visit (INDEPENDENT_AMBULATORY_CARE_PROVIDER_SITE_OTHER): Payer: Managed Care, Other (non HMO) | Admitting: Nurse Practitioner

## 2022-02-12 VITALS — BP 154/98 | HR 93 | Ht 66.0 in | Wt 255.6 lb

## 2022-02-12 DIAGNOSIS — Z6841 Body Mass Index (BMI) 40.0 and over, adult: Secondary | ICD-10-CM

## 2022-02-12 DIAGNOSIS — Z794 Long term (current) use of insulin: Secondary | ICD-10-CM

## 2022-02-12 DIAGNOSIS — E1165 Type 2 diabetes mellitus with hyperglycemia: Secondary | ICD-10-CM | POA: Diagnosis not present

## 2022-02-12 DIAGNOSIS — H9212 Otorrhea, left ear: Secondary | ICD-10-CM | POA: Diagnosis not present

## 2022-02-12 DIAGNOSIS — M25551 Pain in right hip: Secondary | ICD-10-CM | POA: Insufficient documentation

## 2022-02-12 DIAGNOSIS — I10 Essential (primary) hypertension: Secondary | ICD-10-CM | POA: Diagnosis not present

## 2022-02-12 LAB — POCT GLYCOSYLATED HEMOGLOBIN (HGB A1C): HbA1c POC (<> result, manual entry): 5.6 % (ref 4.0–5.6)

## 2022-02-12 LAB — GLUCOSE, POCT (MANUAL RESULT ENTRY): POC Glucose: 137 mg/dl — AB (ref 70–99)

## 2022-02-12 NOTE — Assessment & Plan Note (Signed)
Patient has lost 9 pounds in 2 months. Body mass index is 41.25 kg/m. Advised patient to continue managing diet and follow exercise routine

## 2022-02-12 NOTE — Assessment & Plan Note (Addendum)
Blood pressure 154/98 in the office today. She does taking losartan 50 mg once a day. Advised patient to consume low-salt and heart healthy diet. Advised patient to follow regular exercise. We will continue to monitor and adjust medication if needed.

## 2022-02-12 NOTE — Assessment & Plan Note (Addendum)
Patient blood sugar 137 and hemoglobin A1c 5.6 in the office today. Advised patient to eat high-fiber or low-fat snack before bed time to prevent the dawn phenomenon. Continue  glipizide 5 mg once a day, metformin 1000 mg twice a day, insulin Lantus 60 units and Ozempic.

## 2022-02-12 NOTE — Assessment & Plan Note (Signed)
Referral sent to ENT. 

## 2022-02-12 NOTE — Progress Notes (Signed)
Established Patient Office Visit  Subjective:  Patient ID: Ariana Lindsey, female    DOB: 1981/04/09  Age: 41 y.o. MRN: [id number]  CC:  Chief Complaint  Patient presents with   Follow-up     HPI  Ariana Lindsey presents for routine follow up. Her BS at home range between  140-150 and  her last hemoglobin A1c was 6.4 on 03/31/2021.  Patient has a freestyle continuous blood sugar monitoring device and she states  that her blood sugar her elevates to 180's during morning around 4:30 am.  Patient reports that she checks her blood pressure at home and the blood pressure reading of 120-130/70-80.\  She also complains of ear drainage that is going on for more than 2 months months.  She states her ear pain is better now.  Denies any difficulty hearing.  She also complains of right hip pain if she sits in a specific position for long period of time.   Past Medical History:  Diagnosis Date   Diabetes mellitus without complication ([place])    Hypertension     Past Surgical History:  Procedure Laterality Date   CESAREAN SECTION     x 2   LAPAROSCOPIC HYSTERECTOMY N/A 05/29/2015   Procedure: HYSTERECTOMY TOTAL LAPAROSCOPIC/BILATERAL SALPINGECTOMY;  Surgeon: Honor Loh Ward, MD;  Location: [facility] ORS;  Service: Gynecology;  Laterality: N/A;   TUBAL LIGATION      Family History  Problem Relation Age of Onset   Breast cancer Paternal Grandmother     Social History   Socioeconomic History   Marital status: Single    Spouse name: Not on file   Number of children: Not on file   Years of education: Not on file   Highest education level: Not on file  Occupational History   Not on file  Tobacco Use   Smoking status: Former   Smokeless tobacco: Never  Substance and Sexual Activity   Alcohol use: Yes    Comment: rare   Drug use: No   Sexual activity: Not on file  Other Topics Concern   Not on file  Social History Narrative   Not on file   Social Determinants of Health    Financial Resource Strain: Not on file  Food Insecurity: Not on file  Transportation Needs: Not on file  Physical Activity: Not on file  Stress: Not on file  Social Connections: Not on file  Intimate Partner Violence: Not on file     Outpatient Medications Prior to Visit  Medication Sig Dispense Refill   acetic acid 2 % otic solution Place 4 drops into the left ear 4 (four) times daily. 15 mL 0   Continuous Blood Gluc Sensor (FREESTYLE LIBRE 2 SENSOR) MISC 1 Product by Does not apply route every 14 (fourteen) days. 1 each 6   Continuous Blood Gluc Sensor (FREESTYLE LIBRE 2 SENSOR) MISC 1 each by Does not apply route every 14 (fourteen) days. 1 each 6   glipiZIDE (GLUCOTROL) 5 MG tablet TAKE 1 TABLET BY MOUTH DAILY 30 tablet 6   glipiZIDE (GLUCOTROL) 5 MG tablet Take 1 tablet (5 mg total) by mouth daily. 90 tablet 3   glucose blood test strip Use as instructed 100 each 12   glucose blood test strip Use as instructed 100 each 12   insulin glargine (LANTUS SOLOSTAR) 100 UNIT/ML Solostar Pen INJECT 60 UNITS UNDER THE SKIN EVERY DAY 60 mL 6   insulin glargine (LANTUS SOLOSTAR) 100 UNIT/ML Solostar Pen Inject 60 Units  into the skin 2 (two) times daily. 3 mL 6   Insulin Pen Needle (BD PEN NEEDLE NANO 2ND GEN) 32G X 4 MM MISC USE TO INJECT INSULIN BID 100 each 6   Insulin Syringe-Needle U-100 (INSULIN SYRINGE 1CC/31GX5/16") 31G X 5/16" 1 ML MISC Inject 50 Units into the skin 2 (two) times daily. 100 each 2   losartan (COZAAR) 50 MG tablet TAKE 1 TABLET(50 MG) BY MOUTH DAILY 90 tablet 3   metFORMIN (GLUCOPHAGE) 1000 MG tablet TAKE 1 TABLET BY MOUTH TWICE DAILY 180 tablet 3   Needles & Syringes MISC Use to inject insulin daily 110 each 0   rosuvastatin (CRESTOR) 20 MG tablet TAKE 1 TABLET(20 MG) BY MOUTH DAILY 90 tablet 3   Semaglutide, 1 MG/DOSE, 4 MG/3ML SOPN Inject 1 mg as directed once a week. 3 mL 6   insulin glargine (LANTUS) 100 UNIT/ML injection Inject 0.5 mLs (50 Units total) into the  skin in the morning and at bedtime. 90 mL 4   No facility-administered medications prior to visit.    No Known Allergies  ROS Review of Systems  Constitutional:  Negative for activity change and fatigue.  HENT:  Positive for ear discharge. Negative for congestion, facial swelling and postnasal drip.   Eyes:  Negative for discharge and redness.  Respiratory:  Negative for chest tightness and shortness of breath.   Cardiovascular:  Negative for chest pain and palpitations.  Gastrointestinal:  Negative for abdominal pain and constipation.  Genitourinary:  Negative for difficulty urinating and frequency.  Musculoskeletal:  Negative for arthralgias and joint swelling.       Right hip pain   Skin:  Negative for color change and pallor.  Neurological:  Negative for dizziness, facial asymmetry and headaches.  Psychiatric/Behavioral:  Negative for agitation, behavioral problems, confusion and suicidal ideas.   All other systems reviewed and are negative.     Objective:    Physical Exam Constitutional:      Appearance: Normal appearance. She is obese.  HENT:     Head: Normocephalic.     Right Ear: Hearing and tympanic membrane normal.     Left Ear: Hearing and tympanic membrane normal.     Nose: Nose normal.     Mouth/Throat:     Mouth: Mucous membranes are moist.     Pharynx: Oropharynx is clear.  Eyes:     Extraocular Movements: Extraocular movements intact.     Conjunctiva/sclera: Conjunctivae normal.     Pupils: Pupils are equal, round, and reactive to light.  Cardiovascular:     Rate and Rhythm: Normal rate and regular rhythm.     Pulses: Normal pulses.     Heart sounds: Normal heart sounds.  Pulmonary:     Effort: Pulmonary effort is normal.     Breath sounds: Normal breath sounds.  Abdominal:     General: Bowel sounds are normal.     Palpations: Abdomen is soft.  Musculoskeletal:        General: Normal range of motion.     Cervical back: Normal range of motion.   Skin:    General: Skin is warm.  Neurological:     General: No focal deficit present.     Mental Status: She is alert and oriented to person, place, and time. Mental status is at baseline.  Psychiatric:        Mood and Affect: Mood normal.        Behavior: Behavior normal.  Thought Content: Thought content normal.        Judgment: Judgment normal.     BP (!) 154/98   Pulse 93   Ht '5\' 6"'$  (1.676 m)   Wt 255 lb 9.6 oz (115.9 kg)   LMP 05/10/2015   BMI 41.25 kg/m  Wt Readings from Last 3 Encounters:  02/12/22 255 lb 9.6 oz (115.9 kg)  12/03/21 264 lb 8 oz (120 kg)  05/04/21 264 lb 12.8 oz (120.1 kg)     Health Maintenance Due  Topic Date Due   COVID-19 Vaccine (1) Never done   OPHTHALMOLOGY EXAM  Never done   HIV Screening  Never done   Hepatitis C Screening  Never done   TETANUS/TDAP  Never done   PAP SMEAR-Modifier  Never done   FOOT EXAM  04/03/2021    There are no preventive care reminders to display for this patient.  Lab Results  Component Value Date   TSH 1.280 09/10/2020   Lab Results  Component Value Date   WBC 7.6 09/10/2020   HGB 13.4 09/10/2020   HCT 39.9 09/10/2020   MCV 87 09/10/2020   PLT 315 09/10/2020   Lab Results  Component Value Date   NA 139 05/26/2020   K 4.0 05/26/2020   CO2 25 05/26/2020   GLUCOSE 212 (H) 05/26/2020   BUN 11 05/26/2020   CREATININE 0.58 05/26/2020   BILITOT 0.3 04/23/2017   ALKPHOS 65 04/23/2017   AST 20 04/23/2017   ALT 28 04/23/2017   PROT 7.5 04/23/2017   ALBUMIN 4.5 04/23/2017   CALCIUM 9.1 05/26/2020   ANIONGAP 10 05/26/2020   Lab Results  Component Value Date   CHOL 116 09/10/2020   Lab Results  Component Value Date   HDL 35 (L) 09/10/2020   Lab Results  Component Value Date   LDLCALC 60 09/10/2020   Lab Results  Component Value Date   TRIG 116 09/10/2020   Lab Results  Component Value Date   CHOLHDL 3.3 09/10/2020   Lab Results  Component Value Date   HGBA1C 5.6 02/12/2022       Assessment & Plan:   Problem List Items Addressed This Visit       Cardiovascular and Mediastinum   Hypertension    Blood pressure 154/98 in the office today. She does taking losartan 50 mg once a day. Advised patient to consume low-salt and heart healthy diet. Advised patient to follow regular exercise. We will continue to monitor and adjust medication if needed.        Endocrine   Type 2 diabetes mellitus with hyperglycemia, with long-term current use of insulin ([place]) - Primary    Patient blood sugar 137 and hemoglobin A1c 5.6 in the office today. Advised patient to eat high-fiber or low-fat snack before bed time to prevent the dawn phenomenon. Continue  glipizide 5 mg once a day, metformin 1000 mg twice a day, insulin Lantus 60 units and Ozempic.      Relevant Orders   POCT glucose (manual entry) (Completed)   POCT glycosylated hemoglobin (Hb A1C) (Completed)     Nervous and Auditory   Otorrhea of left ear    Referral sent to ENT      Relevant Orders   Ambulatory referral to ENT     Other   Class 3 severe obesity with serious comorbidity and body mass index (BMI) of 40.0 to 44.9 in adult [facility])    Patient has lost 9 pounds in 2 months. Body  mass index is 41.25 kg/m. Advised patient to continue managing diet and follow exercise routine      Hip pain, acute, right    Advised patient to use cold and hotcompress alternating. Do some stretching exercises after hot compress. We will continue to monitor.        No orders of the defined types were placed in this encounter.    Follow-up: Return in about 3 months (around 05/15/2022), or if symptoms worsen or fail to improve.    Theresia Lo, NP

## 2022-02-12 NOTE — Assessment & Plan Note (Signed)
Advised patient to use cold and hotcompress alternating. Do some stretching exercises after hot compress. We will continue to monitor.

## 2022-03-19 ENCOUNTER — Encounter: Payer: Self-pay | Admitting: Nurse Practitioner

## 2022-03-19 ENCOUNTER — Ambulatory Visit (INDEPENDENT_AMBULATORY_CARE_PROVIDER_SITE_OTHER): Payer: Managed Care, Other (non HMO) | Admitting: Nurse Practitioner

## 2022-03-19 VITALS — BP 130/83 | HR 88 | Ht 66.0 in | Wt 251.1 lb

## 2022-03-19 DIAGNOSIS — E1165 Type 2 diabetes mellitus with hyperglycemia: Secondary | ICD-10-CM | POA: Diagnosis not present

## 2022-03-19 DIAGNOSIS — Z794 Long term (current) use of insulin: Secondary | ICD-10-CM

## 2022-03-19 DIAGNOSIS — Z0000 Encounter for general adult medical examination without abnormal findings: Secondary | ICD-10-CM

## 2022-03-19 DIAGNOSIS — E119 Type 2 diabetes mellitus without complications: Secondary | ICD-10-CM

## 2022-03-19 NOTE — Progress Notes (Unsigned)
Established Patient Office Visit  Subjective:  Patient ID: Ariana Lindsey, female    DOB: 01-Jun-1981  Age: 41 y.o. MRN: [id number]  CC:  Chief Complaint  Patient presents with   Annual Exam     HPI  Ariana Lindsey presents for her annual physical exam.  COVID: [id number] Pap smear: n/a (total hysterectomy)  Dentist: every 6 Eye examination: no Exercise: once or twice a week Mammogram : Due   Diet: Patient does eat meat (Chicken, red meat occasionally). Patient consumes fruits and veggies. Patient does not eat fried food. Patient drinks water , diet mountain dew, and flavored water.    HPI   Past Medical History:  Diagnosis Date   Diabetes mellitus without complication ([place])    Hypertension     Past Surgical History:  Procedure Laterality Date   CESAREAN SECTION     x 2   LAPAROSCOPIC HYSTERECTOMY N/A 05/29/2015   Procedure: HYSTERECTOMY TOTAL LAPAROSCOPIC/BILATERAL SALPINGECTOMY;  Surgeon: Honor Loh Ward, MD;  Location: [facility] ORS;  Service: Gynecology;  Laterality: N/A;   TUBAL LIGATION      Family History  Problem Relation Age of Onset   Breast cancer Paternal Grandmother     Social History   Socioeconomic History   Marital status: Single    Spouse name: Not on file   Number of children: Not on file   Years of education: Not on file   Highest education level: Not on file  Occupational History   Not on file  Tobacco Use   Smoking status: Former   Smokeless tobacco: Never  Substance and Sexual Activity   Alcohol use: Yes    Comment: rare   Drug use: No   Sexual activity: Not on file  Other Topics Concern   Not on file  Social History Narrative   Not on file   Social Determinants of Health   Financial Resource Strain: Not on file  Food Insecurity: Not on file  Transportation Needs: Not on file  Physical Activity: Not on file  Stress: Not on file  Social Connections: Not on file  Intimate Partner Violence: Not on file     Outpatient  Medications Prior to Visit  Medication Sig Dispense Refill   acetic acid 2 % otic solution Place 4 drops into the left ear 4 (four) times daily. 15 mL 0   Continuous Blood Gluc Sensor (FREESTYLE LIBRE 2 SENSOR) MISC 1 Product by Does not apply route every 14 (fourteen) days. 1 each 6   Continuous Blood Gluc Sensor (FREESTYLE LIBRE 2 SENSOR) MISC 1 each by Does not apply route every 14 (fourteen) days. 1 each 6   glipiZIDE (GLUCOTROL) 5 MG tablet TAKE 1 TABLET BY MOUTH DAILY 30 tablet 6   glipiZIDE (GLUCOTROL) 5 MG tablet Take 1 tablet (5 mg total) by mouth daily. 90 tablet 3   glucose blood test strip Use as instructed 100 each 12   glucose blood test strip Use as instructed 100 each 12   insulin glargine (LANTUS SOLOSTAR) 100 UNIT/ML Solostar Pen INJECT 60 UNITS UNDER THE SKIN EVERY DAY 60 mL 6   insulin glargine (LANTUS SOLOSTAR) 100 UNIT/ML Solostar Pen Inject 60 Units into the skin 2 (two) times daily. 3 mL 6   Insulin Pen Needle (BD PEN NEEDLE NANO 2ND GEN) 32G X 4 MM MISC USE TO INJECT INSULIN BID 100 each 6   Insulin Syringe-Needle U-100 (INSULIN SYRINGE 1CC/31GX5/16") 31G X 5/16" 1 ML MISC Inject 50 Units into  the skin 2 (two) times daily. 100 each 2   metFORMIN (GLUCOPHAGE) 1000 MG tablet TAKE 1 TABLET BY MOUTH TWICE DAILY 180 tablet 3   Needles & Syringes MISC Use to inject insulin daily 110 each 0   Semaglutide, 1 MG/DOSE, 4 MG/3ML SOPN Inject 1 mg as directed once a week. 3 mL 6   losartan (COZAAR) 50 MG tablet TAKE 1 TABLET(50 MG) BY MOUTH DAILY 90 tablet 3   rosuvastatin (CRESTOR) 20 MG tablet TAKE 1 TABLET(20 MG) BY MOUTH DAILY 90 tablet 3   insulin glargine (LANTUS) 100 UNIT/ML injection Inject 0.5 mLs (50 Units total) into the skin in the morning and at bedtime. 90 mL 4   No facility-administered medications prior to visit.    No Known Allergies  ROS Review of Systems  Constitutional:  Negative for activity change, diaphoresis and unexpected weight change.  HENT:  Negative.    Eyes: Negative.   Respiratory:  Negative for chest tightness and shortness of breath.   Cardiovascular:  Negative for chest pain and palpitations.  Gastrointestinal: Negative.   Genitourinary: Negative.   Musculoskeletal: Negative.   Neurological:  Negative for dizziness, facial asymmetry and headaches.  Psychiatric/Behavioral:  Negative for agitation, behavioral problems and confusion.       Objective:    Physical Exam Constitutional:      Appearance: Normal appearance. She is obese.  HENT:     Head: Normocephalic.     Right Ear: Tympanic membrane normal.     Left Ear: Tympanic membrane normal.     Nose: Nose normal.     Mouth/Throat:     Mouth: Mucous membranes are moist.     Pharynx: Oropharynx is clear.  Eyes:     Extraocular Movements: Extraocular movements intact.     Conjunctiva/sclera: Conjunctivae normal.     Pupils: Pupils are equal, round, and reactive to light.  Cardiovascular:     Rate and Rhythm: Normal rate and regular rhythm.     Pulses: Normal pulses.     Heart sounds: Normal heart sounds.  Pulmonary:     Effort: Pulmonary effort is normal. No respiratory distress.     Breath sounds: Normal breath sounds. No rhonchi.  Abdominal:     General: Bowel sounds are normal.     Palpations: Abdomen is soft. There is no mass.     Tenderness: There is no abdominal tenderness.     Hernia: No hernia is present.  Musculoskeletal:        General: Normal range of motion.     Cervical back: Neck supple. No tenderness.  Skin:    General: Skin is warm.     Capillary Refill: Capillary refill takes less than 2 seconds.  Neurological:     General: No focal deficit present.     Mental Status: She is alert and oriented to person, place, and time. Mental status is at baseline.  Psychiatric:        Mood and Affect: Mood normal.        Behavior: Behavior normal.        Thought Content: Thought content normal.        Judgment: Judgment normal.     BP 130/83    Pulse 88   Ht '5\' 6"'$  (1.676 m)   Wt 251 lb 1.6 oz (113.9 kg)   LMP 05/10/2015   BMI 40.53 kg/m  Wt Readings from Last 3 Encounters:  03/19/22 251 lb 1.6 oz (113.9 kg)  02/12/22 255 lb 9.6  oz (115.9 kg)  12/03/21 264 lb 8 oz (120 kg)     Health Maintenance Due  Topic Date Due   COVID-19 Vaccine (1) Never done   OPHTHALMOLOGY EXAM  Never done   HIV Screening  Never done   Hepatitis C Screening  Never done   TETANUS/TDAP  Never done   PAP SMEAR-Modifier  Never done   FOOT EXAM  04/03/2021   INFLUENZA VACCINE  03/16/2022    There are no preventive care reminders to display for this patient.  Lab Results  Component Value Date   TSH 1.180 03/19/2022   Lab Results  Component Value Date   WBC 7.5 03/19/2022   HGB 12.4 03/19/2022   HCT 36.8 03/19/2022   MCV 89 03/19/2022   PLT 261 03/19/2022   Lab Results  Component Value Date   NA 139 05/26/2020   K 4.0 05/26/2020   CO2 25 05/26/2020   GLUCOSE 212 (H) 05/26/2020   BUN 11 05/26/2020   CREATININE 0.58 05/26/2020   BILITOT 0.3 04/23/2017   ALKPHOS 65 04/23/2017   AST 20 04/23/2017   ALT 28 04/23/2017   PROT 7.5 04/23/2017   ALBUMIN 4.5 04/23/2017   CALCIUM 9.1 05/26/2020   ANIONGAP 10 05/26/2020   Lab Results  Component Value Date   CHOL 94 (L) 03/19/2022   Lab Results  Component Value Date   HDL 39 (L) 03/19/2022   Lab Results  Component Value Date   LDLCALC 36 03/19/2022   Lab Results  Component Value Date   TRIG 99 03/19/2022   Lab Results  Component Value Date   CHOLHDL 2.4 03/19/2022   Lab Results  Component Value Date   HGBA1C 5.6 02/12/2022      Assessment & Plan:   Problem List Items Addressed This Visit       Endocrine   Type 2 diabetes mellitus with hyperglycemia, with long-term current use of insulin ([facility]) - Primary    Hemoglobin A1c 5.6 on 02-12-2022. Blood sugar 137 in the office today Continue the current medication.       Relevant Orders   POCT glucose (manual entry)      Other   Annual physical exam    Advised patient to consume balanced diet and follow regular exercise routine. Labs ordered      Relevant Orders   CBC with Differential/Platelet (Completed)   COMPLETE METABOLIC PANEL WITH GFR   Lipid panel (Completed)   TSH (Completed)     No orders of the defined types were placed in this encounter.    Follow-up: Return in about 3 months (around 06/19/2022).    Theresia Lo, NP

## 2022-03-20 LAB — CBC WITH DIFFERENTIAL/PLATELET
Basophils Absolute: 0.1 10*3/uL (ref 0.0–0.2)
Basos: 1 %
EOS (ABSOLUTE): 0.2 10*3/uL (ref 0.0–0.4)
Eos: 3 %
Hematocrit: 36.8 % (ref 34.0–46.6)
Hemoglobin: 12.4 g/dL (ref 11.1–15.9)
Immature Grans (Abs): 0 10*3/uL (ref 0.0–0.1)
Immature Granulocytes: 0 %
Lymphocytes Absolute: 3 10*3/uL (ref 0.7–3.1)
Lymphs: 40 %
MCH: 30 pg (ref 26.6–33.0)
MCHC: 33.7 g/dL (ref 31.5–35.7)
MCV: 89 fL (ref 79–97)
Monocytes Absolute: 0.5 10*3/uL (ref 0.1–0.9)
Monocytes: 6 %
Neutrophils Absolute: 3.8 10*3/uL (ref 1.4–7.0)
Neutrophils: 50 %
Platelets: 261 10*3/uL (ref 150–450)
RBC: 4.14 x10E6/uL (ref 3.77–5.28)
RDW: 13.3 % (ref 11.7–15.4)
WBC: 7.5 10*3/uL (ref 3.4–10.8)

## 2022-03-20 LAB — LIPID PANEL
Chol/HDL Ratio: 2.4 ratio (ref 0.0–4.4)
Cholesterol, Total: 94 mg/dL — ABNORMAL LOW (ref 100–199)
HDL: 39 mg/dL — ABNORMAL LOW (ref >39)
LDL Chol Calc (NIH): 36 mg/dL (ref 0–99)
Triglycerides: 99 mg/dL (ref 0–149)
VLDL Cholesterol Cal: 19 mg/dL (ref 5–40)

## 2022-03-20 LAB — TSH: TSH: 1.18 u[IU]/mL (ref 0.450–4.500)

## 2022-03-23 ENCOUNTER — Encounter: Payer: Managed Care, Other (non HMO) | Admitting: Internal Medicine

## 2022-03-26 ENCOUNTER — Other Ambulatory Visit: Payer: Self-pay | Admitting: Internal Medicine

## 2022-03-26 DIAGNOSIS — I7090 Unspecified atherosclerosis: Secondary | ICD-10-CM

## 2022-03-31 ENCOUNTER — Encounter: Payer: Self-pay | Admitting: Nurse Practitioner

## 2022-03-31 DIAGNOSIS — Z1211 Encounter for screening for malignant neoplasm of colon: Secondary | ICD-10-CM | POA: Insufficient documentation

## 2022-03-31 DIAGNOSIS — Z0000 Encounter for general adult medical examination without abnormal findings: Secondary | ICD-10-CM | POA: Insufficient documentation

## 2022-03-31 NOTE — Assessment & Plan Note (Addendum)
Hemoglobin A1c 5.6 on 02-12-2022. Blood sugar 137 in the office today Continue the current medication.

## 2022-03-31 NOTE — Assessment & Plan Note (Signed)
Advised patient to consume balanced diet and follow regular exercise routine. Labs ordered

## 2022-04-01 LAB — GLUCOSE, POCT (MANUAL RESULT ENTRY): POC Glucose: 137 mg/dl — AB (ref 70–99)

## 2022-04-17 ENCOUNTER — Other Ambulatory Visit: Payer: Self-pay | Admitting: Internal Medicine

## 2022-04-27 ENCOUNTER — Other Ambulatory Visit: Payer: Self-pay | Admitting: *Deleted

## 2022-04-27 MED ORDER — GLIPIZIDE 5 MG PO TABS: 5.0000 mg | ORAL_TABLET | Freq: Every day | ORAL | 3 refills | Status: DC

## 2022-05-20 ENCOUNTER — Other Ambulatory Visit: Payer: Self-pay | Admitting: Internal Medicine

## 2022-05-21 ENCOUNTER — Other Ambulatory Visit: Payer: Self-pay | Admitting: Internal Medicine

## 2022-05-21 DIAGNOSIS — Z1231 Encounter for screening mammogram for malignant neoplasm of breast: Secondary | ICD-10-CM

## 2022-06-25 ENCOUNTER — Ambulatory Visit
Admission: RE | Admit: 2022-06-25 | Discharge: 2022-06-25 | Disposition: A | Discharge status: Home / Self Care | Payer: Managed Care, Other (non HMO) | Source: Ambulatory Visit | Attending: Internal Medicine | Admitting: Internal Medicine

## 2022-06-25 DIAGNOSIS — Z1231 Encounter for screening mammogram for malignant neoplasm of breast: Secondary | ICD-10-CM | POA: Diagnosis present

## 2022-07-02 ENCOUNTER — Ambulatory Visit: Payer: Managed Care, Other (non HMO) | Admitting: Nurse Practitioner

## 2022-08-19 ENCOUNTER — Ambulatory Visit: Payer: Managed Care, Other (non HMO) | Admitting: Nurse Practitioner

## 2022-08-20 ENCOUNTER — Ambulatory Visit: Payer: Managed Care, Other (non HMO) | Admitting: Nurse Practitioner

## 2022-08-24 ENCOUNTER — Ambulatory Visit: Payer: Managed Care, Other (non HMO) | Admitting: Internal Medicine

## 2022-08-24 ENCOUNTER — Encounter: Payer: Self-pay | Admitting: Internal Medicine

## 2022-08-24 VITALS — BP 120/74 | HR 88 | Ht 66.0 in | Wt 254.0 lb

## 2022-08-24 DIAGNOSIS — Z720 Tobacco use: Secondary | ICD-10-CM

## 2022-08-24 DIAGNOSIS — Z87891 Personal history of nicotine dependence: Secondary | ICD-10-CM | POA: Diagnosis not present

## 2022-08-24 DIAGNOSIS — E1165 Type 2 diabetes mellitus with hyperglycemia: Secondary | ICD-10-CM | POA: Diagnosis not present

## 2022-08-24 DIAGNOSIS — J029 Acute pharyngitis, unspecified: Secondary | ICD-10-CM | POA: Diagnosis not present

## 2022-08-24 DIAGNOSIS — Z794 Long term (current) use of insulin: Secondary | ICD-10-CM

## 2022-08-24 DIAGNOSIS — I10 Essential (primary) hypertension: Secondary | ICD-10-CM | POA: Diagnosis not present

## 2022-08-24 MED ORDER — AZITHROMYCIN 250 MG PO TABS
ORAL_TABLET | ORAL | 0 refills | Status: AC
Start: 2022-08-24 — End: 2022-08-29

## 2022-08-24 NOTE — Assessment & Plan Note (Signed)

## 2022-08-24 NOTE — Assessment & Plan Note (Signed)
Patient does not smoke anymore ?

## 2022-08-24 NOTE — Progress Notes (Signed)
Established Patient Office Visit  Subjective:  Patient ID: Ariana Lindsey, female    DOB: 07-Apr-1981  Age: 42 y.o. MRN: [id number]  CC:  Chief Complaint  Patient presents with   Follow-up    HPI  Ariana Lindsey presents for uri  Past Medical History:  Diagnosis Date   Diabetes mellitus without complication ([place])    Hypertension     Past Surgical History:  Procedure Laterality Date   CESAREAN SECTION     x 2   LAPAROSCOPIC HYSTERECTOMY N/A 05/29/2015   Procedure: HYSTERECTOMY TOTAL LAPAROSCOPIC/BILATERAL SALPINGECTOMY;  Surgeon: Honor Loh Ward, MD;  Location: [facility] ORS;  Service: Gynecology;  Laterality: N/A;   TUBAL LIGATION      Family History  Problem Relation Age of Onset   Breast cancer Paternal Grandmother     Social History   Socioeconomic History   Marital status: Single    Spouse name: Not on file   Number of children: Not on file   Years of education: Not on file   Highest education level: Not on file  Occupational History   Not on file  Tobacco Use   Smoking status: Former   Smokeless tobacco: Never  Substance and Sexual Activity   Alcohol use: Yes    Comment: rare   Drug use: No   Sexual activity: Not on file  Other Topics Concern   Not on file  Social History Narrative   Not on file   Social Determinants of Health   Financial Resource Strain: Not on file  Food Insecurity: Not on file  Transportation Needs: Not on file  Physical Activity: Not on file  Stress: Not on file  Social Connections: Not on file  Intimate Partner Violence: Not on file     Current Outpatient Medications:    acetic acid 2 % otic solution, Place 4 drops into the left ear 4 (four) times daily., Disp: 15 mL, Rfl: 0   azithromycin (ZITHROMAX) 250 MG tablet, Take 2 tablets on day 1, then 1 tablet daily on days 2 through 5, Disp: 6 tablet, Rfl: 0   Continuous Blood Gluc Sensor (FREESTYLE LIBRE 2 SENSOR) MISC, 1 Product by Does not apply route every 14 (fourteen)  days., Disp: 1 each, Rfl: 6   Continuous Blood Gluc Sensor (FREESTYLE LIBRE 2 SENSOR) MISC, 1 each by Does not apply route every 14 (fourteen) days., Disp: 1 each, Rfl: 6   glipiZIDE (GLUCOTROL) 5 MG tablet, Take 1 tablet (5 mg total) by mouth daily., Disp: 90 tablet, Rfl: 3   glucose blood test strip, Use as instructed, Disp: 100 each, Rfl: 12   insulin glargine (LANTUS SOLOSTAR) 100 UNIT/ML Solostar Pen, Inject 60 Units into the skin 2 (two) times daily., Disp: 3 mL, Rfl: 6   Insulin Pen Needle (BD PEN NEEDLE NANO 2ND GEN) 32G X 4 MM MISC, USE TO INJECT INSULIN BID, Disp: 100 each, Rfl: 6   Insulin Syringe-Needle U-100 (INSULIN SYRINGE 1CC/31GX5/16") 31G X 5/16" 1 ML MISC, Inject 50 Units into the skin 2 (two) times daily., Disp: 100 each, Rfl: 2   losartan (COZAAR) 50 MG tablet, TAKE 1 TABLET(50 MG) BY MOUTH DAILY, Disp: 90 tablet, Rfl: 3   metFORMIN (GLUCOPHAGE) 1000 MG tablet, TAKE 1 TABLET BY MOUTH TWICE DAILY, Disp: 180 tablet, Rfl: 3   Needles & Syringes MISC, Use to inject insulin daily, Disp: 110 each, Rfl: 0   OZEMPIC, 1 MG/DOSE, 4 MG/3ML SOPN, INJECT 1 MG UNDER THE SKIN ONCE  A WEEK, Disp: 3 mL, Rfl: 6   rosuvastatin (CRESTOR) 20 MG tablet, TAKE 1 TABLET(20 MG) BY MOUTH DAILY, Disp: 90 tablet, Rfl: 3   insulin glargine (LANTUS) 100 UNIT/ML injection, Inject 0.5 mLs (50 Units total) into the skin in the morning and at bedtime., Disp: 90 mL, Rfl: 4   No Known Allergies  ROS Review of Systems  Constitutional: Negative.   HENT:  Positive for congestion, postnasal drip and rhinorrhea. Negative for facial swelling and hearing loss.   Eyes: Negative.   Respiratory: Negative.    Cardiovascular: Negative.   Gastrointestinal: Negative.   Endocrine: Negative.   Genitourinary: Negative.   Musculoskeletal: Negative.   Skin: Negative.   Allergic/Immunologic: Negative.   Neurological: Negative.   Hematological: Negative.   Psychiatric/Behavioral: Negative.    All other systems reviewed  and are negative.        Objective:    Physical Exam Vitals reviewed.  Constitutional:      Appearance: Normal appearance.  HENT:     Mouth/Throat:     Mouth: Mucous membranes are moist.  Eyes:     Pupils: Pupils are equal, round, and reactive to light.  Neck:     Vascular: No carotid bruit.  Cardiovascular:     Rate and Rhythm: Normal rate and regular rhythm.     Pulses: Normal pulses.     Heart sounds: Normal heart sounds.  Pulmonary:     Effort: Pulmonary effort is normal.     Breath sounds: Normal breath sounds.  Abdominal:     General: Bowel sounds are normal.     Palpations: Abdomen is soft. There is no hepatomegaly, splenomegaly or mass.     Tenderness: There is no abdominal tenderness.     Hernia: No hernia is present.  Musculoskeletal:        General: No tenderness.     Cervical back: Neck supple.     Right lower leg: No edema.     Left lower leg: No edema.  Skin:    Findings: No rash.  Neurological:     Mental Status: She is alert and oriented to person, place, and time.     Motor: No weakness.  Psychiatric:        Mood and Affect: Mood and affect normal.        Behavior: Behavior normal.     BP 120/74   Pulse 88   Ht '5\' 6"'$  (1.676 m)   Wt 254 lb (115.2 kg)   LMP 05/10/2015   SpO2 93%   BMI 41.00 kg/m  Wt Readings from Last 3 Encounters:  08/24/22 254 lb (115.2 kg)  03/19/22 251 lb 1.6 oz (113.9 kg)  02/12/22 255 lb 9.6 oz (115.9 kg)     Health Maintenance Due  Topic Date Due   COVID-19 Vaccine (1) Never done   OPHTHALMOLOGY EXAM  Never done   HIV Screening  Never done   Diabetic kidney evaluation - Urine ACR  Never done   Hepatitis C Screening  Never done   DTaP/Tdap/Td (1 - Tdap) Never done   PAP SMEAR-Modifier  Never done   FOOT EXAM  04/03/2021   Diabetic kidney evaluation - eGFR measurement  05/26/2021   INFLUENZA VACCINE  Never done   HEMOGLOBIN A1C  08/14/2022    There are no preventive care reminders to display for this  patient.  Lab Results  Component Value Date   TSH 1.180 03/19/2022   Lab Results  Component Value Date  WBC 7.5 03/19/2022   HGB 12.4 03/19/2022   HCT 36.8 03/19/2022   MCV 89 03/19/2022   PLT 261 03/19/2022   Lab Results  Component Value Date   NA 139 05/26/2020   K 4.0 05/26/2020   CO2 25 05/26/2020   GLUCOSE 212 (H) 05/26/2020   BUN 11 05/26/2020   CREATININE 0.58 05/26/2020   BILITOT 0.3 04/23/2017   ALKPHOS 65 04/23/2017   AST 20 04/23/2017   ALT 28 04/23/2017   PROT 7.5 04/23/2017   ALBUMIN 4.5 04/23/2017   CALCIUM 9.1 05/26/2020   ANIONGAP 10 05/26/2020   Lab Results  Component Value Date   CHOL 94 (L) 03/19/2022   Lab Results  Component Value Date   HDL 39 (L) 03/19/2022   Lab Results  Component Value Date   LDLCALC 36 03/19/2022   Lab Results  Component Value Date   TRIG 99 03/19/2022   Lab Results  Component Value Date   CHOLHDL 2.4 03/19/2022   Lab Results  Component Value Date   HGBA1C 5.6 02/12/2022      Assessment & Plan:   Problem List Items Addressed This Visit       Cardiovascular and Mediastinum   Hypertension - Primary     Respiratory   Pharyngitis    Patient was given Z-Pak   Patient denies any chest pain or shortness of breath there is no history of palpitation or paroxysmal nocturnal dyspnea   patient was advised to follow low-salt low-cholesterol diet    ideally I want to keep systolic blood pressure below 130 mmHg, patient was asked to check blood pressure one times a week and give me a report on that.  Patient will be follow-up in 3 months  or earlier as needed, patient will call me back for any change in the cardiovascular symptoms Patient was advised to buy a book from local bookstore concerning blood pressure and read several chapters  every day.  This will be supplemented by some of the material we will give him from the office.  Patient should also utilize other resources like YouTube and Internet to learn more  about the blood pressure and the diet.        Endocrine   Type 2 diabetes mellitus with hyperglycemia, with long-term current use of insulin ([place])    - The patient's blood sugar is labile on med. - The patient will continue the current treatment regimen.  - I encouraged the patient to regularly check blood sugar.  - I encouraged the patient to monitor diet. I encouraged the patient to eat low-carb and low-sugar to help prevent blood sugar spikes.  - I encouraged the patient to continue following their prescribed treatment plan for diabetes - I informed the patient to get help if blood sugar drops below '54mg'$ /dL, or if suddenly have trouble thinking clearly or breathing.  Patient was advised to buy a book on diabetes from a local bookstore or from [place]  Patient should read 2 chapters every day to keep the motivation going, this is in addition to some of the materials we provided them from the office.  There are other resources on the Internet like YouTube and [employer]kip[employer] to get an education on the diabetes        Other   Tobacco abuse    Patient does not smoke anymore       Meds ordered this encounter  Medications   azithromycin (ZITHROMAX) 250 MG tablet    Sig: Take 2  tablets on day 1, then 1 tablet daily on days 2 through 5    Dispense:  6 tablet    Refill:  0    Follow-up: No follow-ups on file.    Cletis Athens, MD

## 2022-08-24 NOTE — Assessment & Plan Note (Addendum)
Patient was given Z-Pak   Patient denies any chest pain or shortness of breath there is no history of palpitation or paroxysmal nocturnal dyspnea   patient was advised to follow low-salt low-cholesterol diet    ideally I want to keep systolic blood pressure below 130 mmHg, patient was asked to check blood pressure one times a week and give me a report on that.  Patient will be follow-up in 3 months  or earlier as needed, patient will call me back for any change in the cardiovascular symptoms Patient was advised to buy a book from local bookstore concerning blood pressure and read several chapters  every day.  This will be supplemented by some of the material we will give him from the office.  Patient should also utilize other resources like YouTube and Internet to learn more about the blood pressure and the diet.

## 2022-11-04 IMAGING — MG MM DIGITAL DIAGNOSTIC UNILAT*L* W/ TOMO W/ CAD
4 series · 4 of 12 positions shown · non-contrast
Comparison: Previous exam(s).

CLINICAL DATA: Recall from screening mammogram for an asymmetry in
the left breast.

EXAM:
DIGITAL DIAGNOSTIC LEFT MAMMOGRAM WITH CAD AND TOMOSYNTHESIS
ULTRASOUND LEFT BREAST
TECHNIQUE: Left digital diagnostic mammography and breast tomosynthesis was
performed. Digital images of the breasts were evaluated with
computer-aided detection. Targeted ultrasound examination of the
Left breast was performed.

[L CC synth-2D]
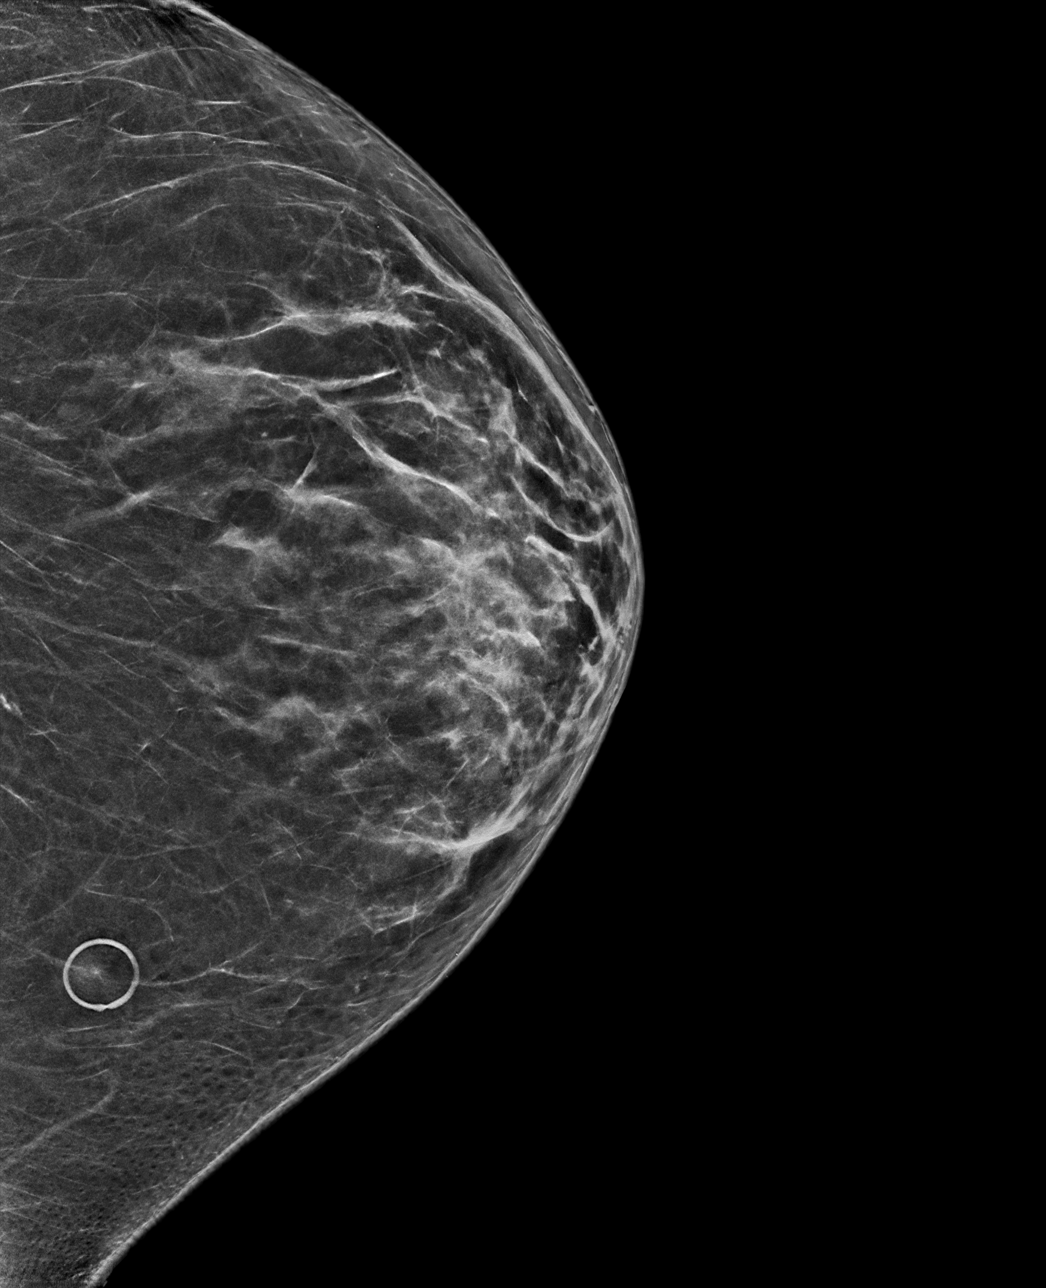

[L ML synth-2D]
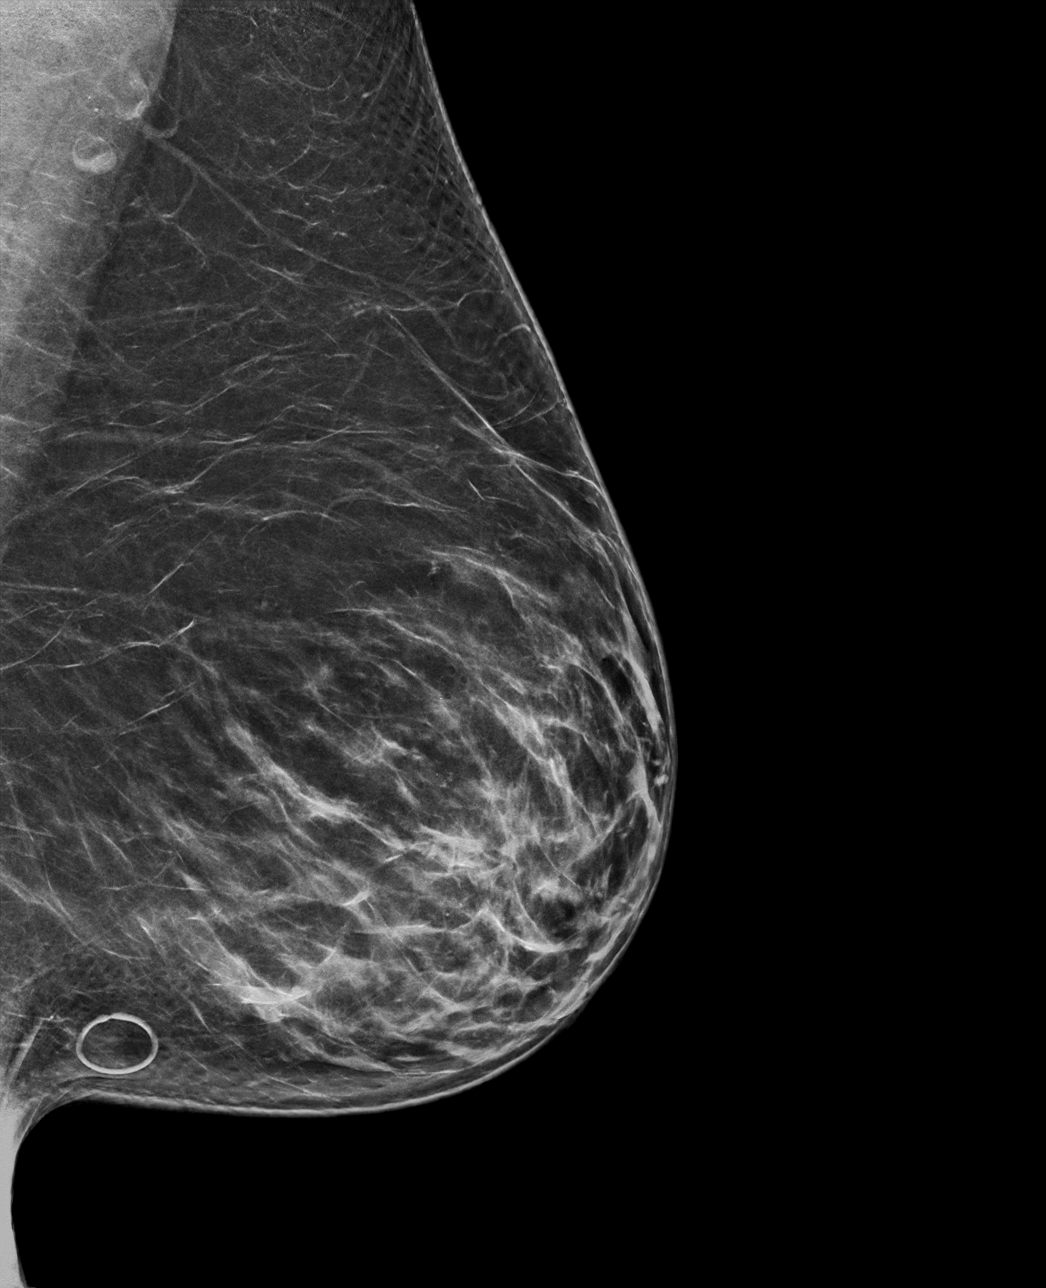

[L ML tomo · tomo slice 43/86.0]
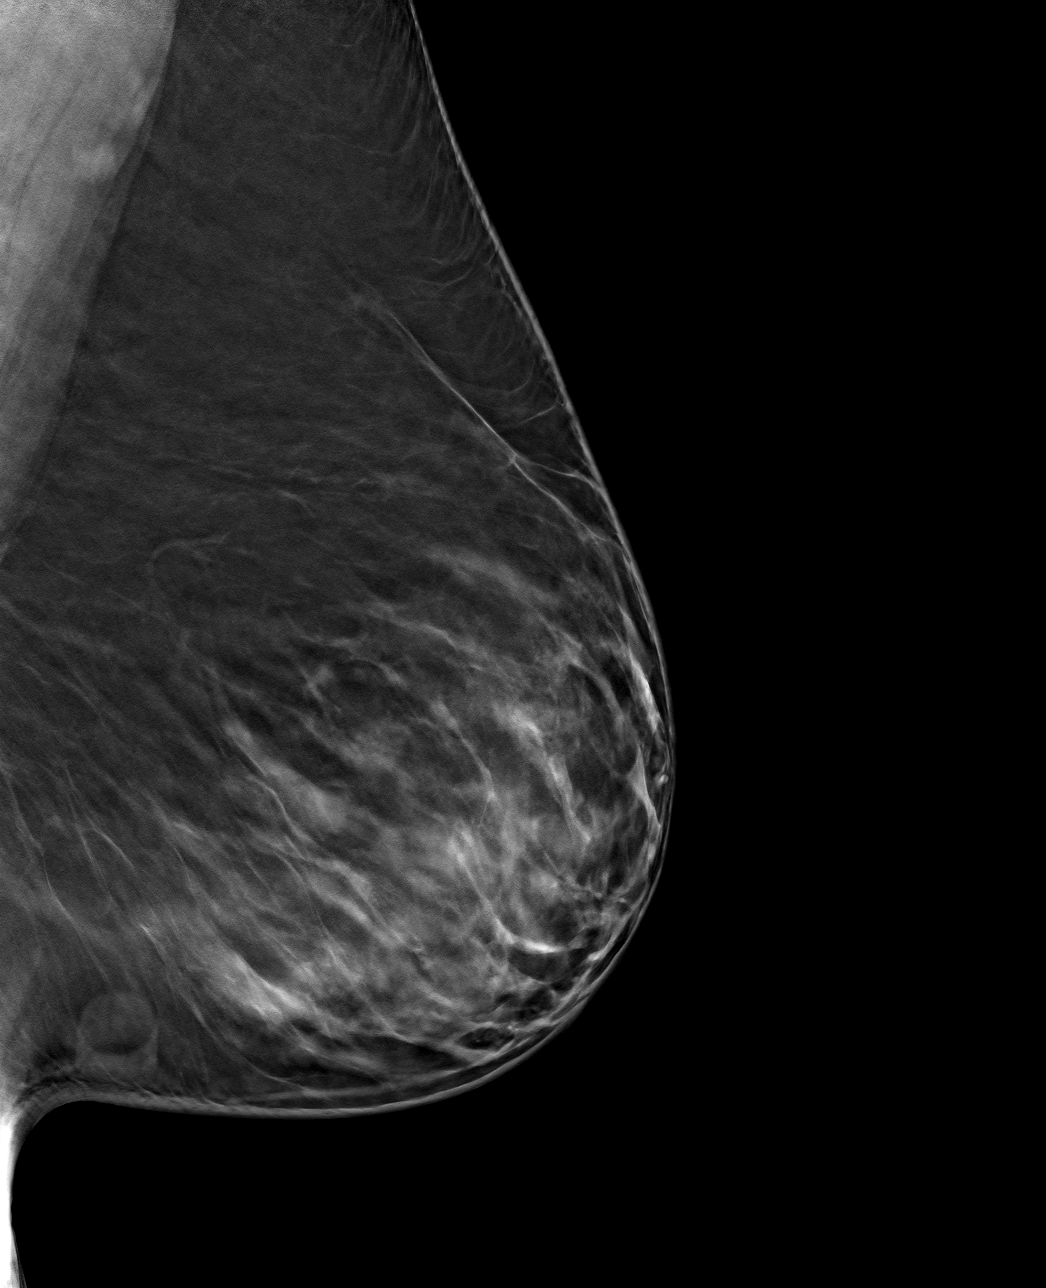

[L CC tomo · tomo slice 39/78.0]
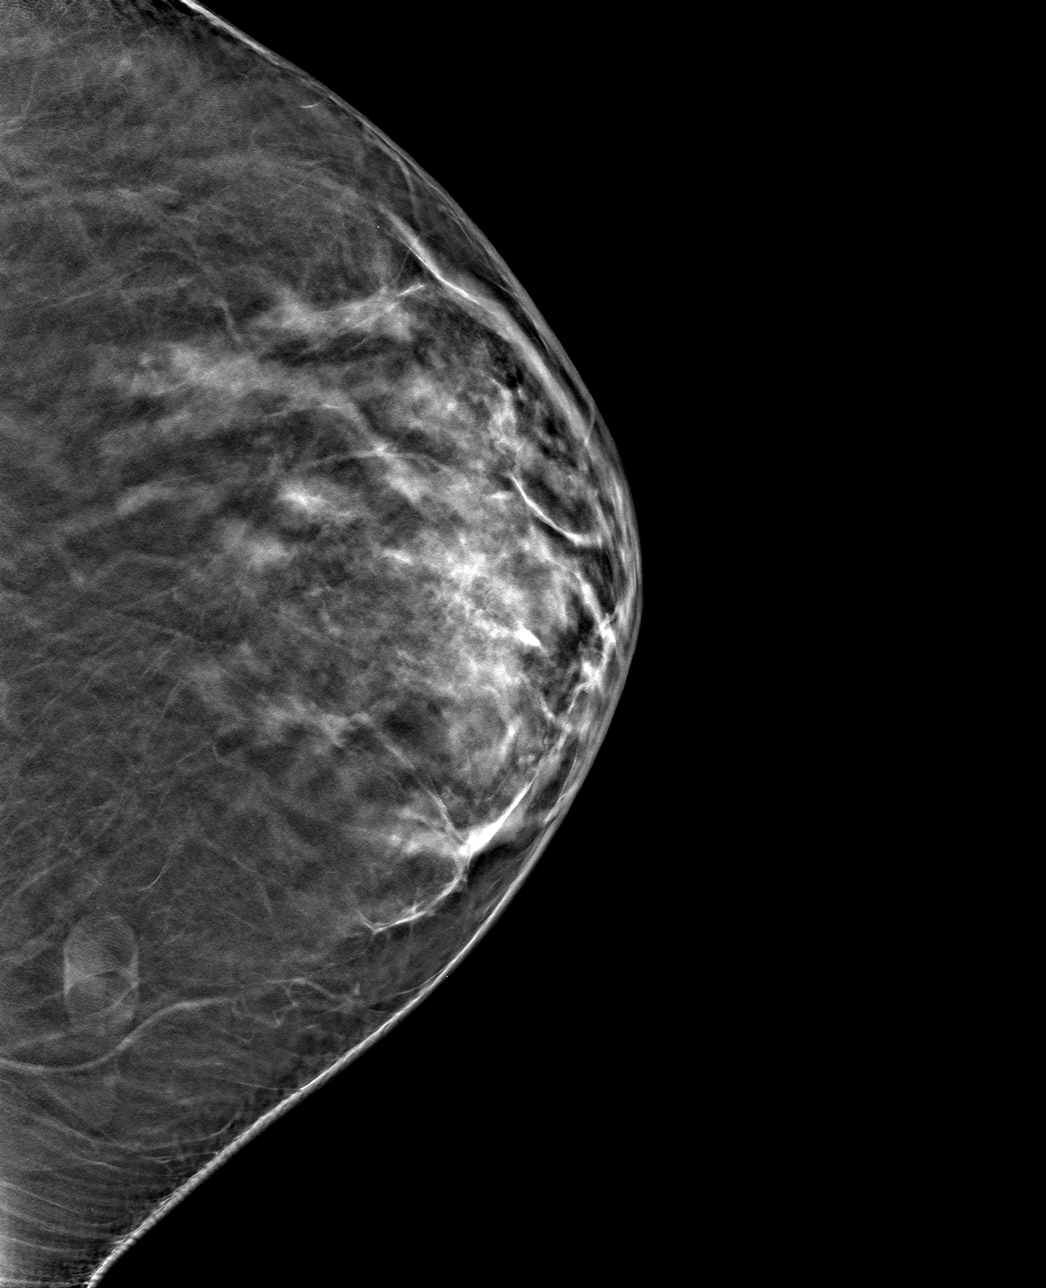

[4 of 12 positions shown; findings below may reference images not displayed]

ACR Breast Density Category b: There are scattered areas of
fibroglandular density.
FINDINGS: The patient reports a dermal mass in the lower inner left breast
which intermittently drains material. A skin marker was placed
overlying this area. Mammogram views demonstrate that the previously
questioned asymmetry corresponds to this intradermal mass.

Targeted left breast ultrasound was performed.

At 8 o'clock 9 cm from the nipple an oval circumscribed hypoechoic
intradermal mass measures 8 x 2 x 7 mm. This corresponds to the
finding seen on the screening mammogram and the area indicated by
the patient. No suspicious finding is identified.
IMPRESSION: Intradermal mass in the left breast is consistent with a benign
sebaceous cyst versus epidermal inclusion cyst. No evidence of
malignancy.

RECOMMENDATION:
Recommend routine annual screening mammogram in June 2021.

I have discussed the findings and recommendations with the patient.
If applicable, a reminder letter will be sent to the patient
regarding the next appointment.

BI-RADS CATEGORY  2: Benign.

## 2023-04-17 ENCOUNTER — Other Ambulatory Visit: Payer: Self-pay

## 2023-04-17 ENCOUNTER — Emergency Department
Admission: EM | Admit: 2023-04-17 | Discharge: 2023-04-17 | Disposition: A | Discharge status: Home / Self Care | Payer: Managed Care, Other (non HMO) | Attending: Student in an Organized Health Care Education/Training Program | Admitting: Student in an Organized Health Care Education/Training Program

## 2023-04-17 ENCOUNTER — Encounter: Payer: Self-pay | Admitting: Emergency Medicine

## 2023-04-17 ENCOUNTER — Emergency Department: Payer: Managed Care, Other (non HMO)

## 2023-04-17 DIAGNOSIS — R079 Chest pain, unspecified: Secondary | ICD-10-CM | POA: Insufficient documentation

## 2023-04-17 DIAGNOSIS — M546 Pain in thoracic spine: Secondary | ICD-10-CM | POA: Diagnosis present

## 2023-04-17 DIAGNOSIS — R202 Paresthesia of skin: Secondary | ICD-10-CM | POA: Insufficient documentation

## 2023-04-17 DIAGNOSIS — M549 Dorsalgia, unspecified: Secondary | ICD-10-CM

## 2023-04-17 LAB — CBC
HCT: 36.7 % (ref 36.0–46.0)
Hemoglobin: 11.9 g/dL — ABNORMAL LOW (ref 12.0–15.0)
MCH: 29.8 pg (ref 26.0–34.0)
MCHC: 32.4 g/dL (ref 30.0–36.0)
MCV: 91.8 fL (ref 80.0–100.0)
Platelets: 234 10*3/uL (ref 150–400)
RBC: 4 MIL/uL (ref 3.87–5.11)
RDW: 14.1 % (ref 11.5–15.5)
WBC: 6.7 10*3/uL (ref 4.0–10.5)
nRBC: 0 % (ref 0.0–0.2)

## 2023-04-17 LAB — BASIC METABOLIC PANEL
Anion gap: 6 (ref 5–15)
BUN: 15 mg/dL (ref 6–20)
CO2: 26 mmol/L (ref 22–32)
Calcium: 8.7 mg/dL — ABNORMAL LOW (ref 8.9–10.3)
Chloride: 104 mmol/L (ref 98–111)
Creatinine, Ser: 0.48 mg/dL (ref 0.44–1.00)
GFR, Estimated: >60 mL/min (ref >60)
Glucose, Bld: 161 mg/dL — ABNORMAL HIGH (ref 70–99)
Potassium: 4 mmol/L (ref 3.5–5.1)
Sodium: 136 mmol/L (ref 135–145)

## 2023-04-17 LAB — TROPONIN I (HIGH SENSITIVITY): Troponin I (High Sensitivity): <2 ng/L (ref <18)

## 2023-04-17 MED ORDER — CYCLOBENZAPRINE HCL 10 MG PO TABS: 10.0000 mg | ORAL_TABLET | Freq: Three times a day (TID) | ORAL | 0 refills | Status: DC | PRN

## 2023-04-17 MED ORDER — PREDNISONE 20 MG PO TABS: 40.0000 mg | ORAL_TABLET | Freq: Every day | ORAL | 0 refills | Status: AC

## 2023-04-17 NOTE — ED Triage Notes (Signed)
Pt here with chest and back pain x1 week. Pt states pain is right sided and radiates to her back and right arm. Pt states pain is intermittent but is getting to the point where she cannot sleep. Pt describes pain as sharp in nature. Pt denies NVD.

## 2023-04-17 NOTE — ED Notes (Addendum)
See triage notes. Patient stated she woke up 8/23 with pain in her right chest and arm. Patient stated if she bends her neck down, it hurts mid back. If she bends her neck up, she isn't able to look as far down.

## 2023-04-17 NOTE — ED Provider Notes (Signed)
[facility] Provider Note    Event Date/Time   First MD Initiated Contact with Patient 04/17/23 [phone]     (approximate)   History   Chest Pain and Back Pain   HPI  Ariana Lindsey is a 42 y.o. female presents to the ER for valuation of several days of right upper back pain and shoulder pain tingling in her arm worsened with movement.  Works at [employer] job finding that she is frequently having to get up sugar shoulders to improve symptoms.  Couple weeks ago did have cough suspected COVID illness but that has resolved she is feeling much improved.  No fevers or chills.  No chest pressure or tightness.  She is not on any birth control.  No history of DVT or PE.  Not on OCP.     Physical Exam   Triage Vital Signs: ED Triage Vitals [04/17/23 0758]  Encounter Vitals Group     BP (!) 152/88     Systolic BP Percentile      Diastolic BP Percentile      Pulse Rate 88     Resp 16     Temp 97.9 F (36.6 C)     Temp Source Oral     SpO2 98 %     Weight 253 lb 15.5 oz (115.2 kg)     Height 5\' 6"  (1.676 m)     Head Circumference      Peak Flow      Pain Score 4     Pain Loc      Pain Education      Exclude from Growth Chart     Most recent vital signs: Vitals:   04/17/23 0758  BP: (!) 152/88  Pulse: 88  Resp: 16  Temp: 97.9 F (36.6 C)  SpO2: 98%     Constitutional: Alert  Eyes: Conjunctivae are normal.  Head: Atraumatic. Nose: No congestion/rhinnorhea. Mouth/Throat: Mucous membranes are moist.   Neck: Painless ROM.  Cardiovascular:   Good peripheral circulation. Respiratory: Normal respiratory effort.  No wheezing. Gastrointestinal: Soft and nontender.  Musculoskeletal:  no deformity.  Pain localized to the right scapula reproducible with palpation and raising the right arm above 90 degrees.  Neurovascular intact distally. Neurologic:  MAE spontaneously. No gross focal neurologic deficits are appreciated.  Skin:  Skin is warm, dry and  intact. No rash noted. Psychiatric: Mood and affect are normal. Speech and behavior are normal.    ED Results / Procedures / Treatments   Labs (all labs ordered are listed, but only abnormal results are displayed) Labs Reviewed  BASIC METABOLIC PANEL - Abnormal; Notable for the following components:      Result Value   Glucose, Bld 161 (*)    Calcium 8.7 (*)    All other components within normal limits  CBC - Abnormal; Notable for the following components:   Hemoglobin 11.9 (*)    All other components within normal limits  TROPONIN I (HIGH SENSITIVITY)     EKG  ED ECG REPORT I, Willy Eddy, the attending physician, personally viewed and interpreted this ECG.   Date: 04/17/2023  EKG Time: 7:55  Rate: 90  Rhythm: sinus  Axis: normal  Intervals: normal  ST&T Change: no stemi, no depressions    RADIOLOGY Please see ED Course for my review and interpretation.  I personally reviewed all radiographic images ordered to evaluate for the above acute complaints and reviewed radiology reports and findings.  These findings  were personally discussed with the patient.  Please see medical record for radiology report.    PROCEDURES:  Critical Care performed: No  Procedures   MEDICATIONS ORDERED IN ED: Medications - No data to display   IMPRESSION / MDM / ASSESSMENT AND PLAN / ED COURSE  I reviewed the triage vital signs and the nursing notes.                              Differential diagnosis includes, but is not limited to, musculoskeletal strain, capsulitis, rotator cuff injury, neuropraxia, radiculopathy, ACS, dissection, PE, pneumonia, pneumothorax  Patient presenting to the ER for evaluation of symptoms as described above.  Based on symptoms, risk factors and considered above differential, this presenting complaint could reflect a potentially life-threatening illness therefore the patient will be placed on continuous pulse oximetry and telemetry for monitoring.   Laboratory evaluation will be sent to evaluate for the above complaints.  Patient is well-appearing in no acute distress.  Based on presentation history and examination however feel most likely musculoskeletal in nature.  EKG is nonischemic.  Troponin negative.  Does not seem consistent with ACS.  She is low risk by Wells criteria is PERC negative.  This is not consistent with dissection.   Clinical Course as of 04/17/23 0900  Sun Apr 17, 2023  5621 Chest x-ray my review and interpretation without evidence of pneumothorax. [PR]    Clinical Course User Index [PR] Willy Eddy, MD   She does appear stable and appropriate for outpatient follow-up will give symptomatic treatment.  Discussed signs and symptoms for which she should return to the ER.  FINAL CLINICAL IMPRESSION(S) / ED DIAGNOSES   Final diagnoses:  Upper back pain on right side     Rx / DC Orders   ED Discharge Orders     None        Note:  This document was prepared using Dragon voice recognition software and may include unintentional dictation errors.    Willy Eddy, MD 04/17/23 [phone]

## 2023-07-05 ENCOUNTER — Other Ambulatory Visit: Payer: Self-pay | Admitting: Internal Medicine

## 2023-07-05 DIAGNOSIS — Z1231 Encounter for screening mammogram for malignant neoplasm of breast: Secondary | ICD-10-CM

## 2023-08-11 ENCOUNTER — Ambulatory Visit
Admission: RE | Admit: 2023-08-11 | Discharge: 2023-08-11 | Disposition: A | Discharge status: Home / Self Care | Payer: Managed Care, Other (non HMO) | Source: Ambulatory Visit | Attending: Internal Medicine | Admitting: Internal Medicine

## 2023-08-11 DIAGNOSIS — Z1231 Encounter for screening mammogram for malignant neoplasm of breast: Secondary | ICD-10-CM | POA: Insufficient documentation

## 2023-10-06 ENCOUNTER — Ambulatory Visit
Admission: EM | Admit: 2023-10-06 | Discharge: 2023-10-06 | Disposition: A | Discharge status: Home / Self Care | Payer: Managed Care, Other (non HMO) | Attending: Emergency Medicine | Admitting: Emergency Medicine

## 2023-10-06 DIAGNOSIS — J069 Acute upper respiratory infection, unspecified: Secondary | ICD-10-CM

## 2023-10-06 LAB — POCT RAPID STREP A (OFFICE): Rapid Strep A Screen: NEGATIVE

## 2023-10-06 LAB — POC COVID19/FLU A&B COMBO
Covid Antigen, POC: NEGATIVE
Influenza A Antigen, POC: NEGATIVE
Influenza B Antigen, POC: NEGATIVE

## 2023-10-06 NOTE — Discharge Instructions (Signed)
Your symptoms today are most likely being caused by a virus and should steadily improve in time it can take up to 7 to 10 days before you truly start to see a turnaround however things will get better  COVID, flu and strep testing negative  For high blood pressure use Coricidin, avoid syrup based medicine to lessen effects on blood sugar    You can take Tylenol and/or Ibuprofen as needed for fever reduction and pain relief.   For cough: honey 1/2 to 1 teaspoon (you can dilute the honey in water or another fluid).  You can also use guaifenesin and dextromethorphan for cough. You can use a humidifier for chest congestion and cough.  If you don't have a humidifier, you can sit in the bathroom with the hot shower running.      For sore throat: try warm salt water gargles, cepacol lozenges, throat spray, warm tea or water with lemon/honey, popsicles or ice, or OTC cold relief medicine for throat discomfort.   For congestion: take a daily anti-histamine like Zyrtec, Claritin, and a oral decongestant, such as pseudoephedrine.  You can also use Flonase 1-2 sprays in each nostril daily.   It is important to stay hydrated: drink plenty of fluids (water, gatorade/powerade/pedialyte, juices, or teas) to keep your throat moisturized and help further relieve irritation/discomfort.

## 2023-10-06 NOTE — ED Provider Notes (Signed)
Renaldo Fiddler    CSN: [id number] Arrival date & time: 10/06/23  1225      History   Chief Complaint Chief Complaint  Patient presents with   Cough   Headache    HPI Ariana Lindsey is a 42 y.o. female.   Presents for evaluation of subjective fever, chills, body aches, sore throat, nonproductive cough, intermittent headaches present for 1 day.  Coughing exacerbates throat pain making it feels like razor blades.  Denies diarrhea or constipation but at change in consistency.  Decreased appetite.  Known sick contacts at work.  Denies shortness of breath or wheezing.  Has not attempted treatment.  Past Medical History:  Diagnosis Date   Diabetes mellitus without complication (HCC)    Hypertension     Patient Active Problem List   Diagnosis Date Noted   Annual physical exam 03/31/2022   Otorrhea of left ear 02/12/2022   Hip pain, acute, right 02/12/2022   Ear pain, bilateral 12/09/2021   Class 3 severe obesity with serious comorbidity and body mass index (BMI) of 40.0 to 44.9 in adult [facility]) 12/09/2021   Pharyngitis 05/04/2021   Rosacea 12/19/2020   Hypertension    Atherosclerosis 04/03/2020   Weight gain 04/03/2020   Tobacco abuse 04/03/2020   Type 2 diabetes mellitus with hyperglycemia, with long-term current use of insulin (HCC) 04/03/2020   S/P total hysterectomy 05/29/2015    Past Surgical History:  Procedure Laterality Date   CESAREAN SECTION     x 2   LAPAROSCOPIC HYSTERECTOMY N/A 05/29/2015   Procedure: HYSTERECTOMY TOTAL LAPAROSCOPIC/BILATERAL SALPINGECTOMY;  Surgeon: Elenora Fender Ward, MD;  Location: [facility] ORS;  Service: Gynecology;  Laterality: N/A;   TUBAL LIGATION      OB History   No obstetric history on file.      Home Medications    Prior to Admission medications   Medication Sig Start Date End Date Taking? Authorizing Provider  glipiZIDE (GLUCOTROL) 5 MG tablet Take 1 tablet (5 mg total) by mouth daily. 04/27/22  Yes Masoud, Renda Rolls, MD   insulin glargine (LANTUS SOLOSTAR) 100 UNIT/ML Solostar Pen Inject 60 Units into the skin 2 (two) times daily. 08/31/21  Yes Masoud, Renda Rolls, MD  losartan (COZAAR) 50 MG tablet TAKE 1 TABLET(50 MG) BY MOUTH DAILY 03/26/22  Yes Masoud, Renda Rolls, MD  metFORMIN (GLUCOPHAGE) 1000 MG tablet TAKE 1 TABLET BY MOUTH TWICE DAILY 04/20/22  Yes Masoud, Javed, MD  OZEMPIC, 1 MG/DOSE, 4 MG/3ML SOPN INJECT 1 MG UNDER THE SKIN ONCE A WEEK 05/21/22  Yes Masoud, Renda Rolls, MD  rosuvastatin (CRESTOR) 20 MG tablet TAKE 1 TABLET(20 MG) BY MOUTH DAILY 03/26/22  Yes Masoud, Renda Rolls, MD  acetic acid 2 % otic solution Place 4 drops into the left ear 4 (four) times daily. 12/03/21   Kara Dies, NP  Continuous Blood Gluc Sensor (FREESTYLE LIBRE 2 SENSOR) MISC 1 Product by Does not apply route every 14 (fourteen) days. 09/12/20   Irish Lack, FNP  Continuous Blood Gluc Sensor (FREESTYLE LIBRE 2 SENSOR) MISC 1 each by Does not apply route every 14 (fourteen) days. 12/21/21   Corky Downs, MD  cyclobenzaprine (FLEXERIL) 10 MG tablet Take 1 tablet (10 mg total) by mouth 3 (three) times daily as needed for muscle spasms. 04/17/23   Willy Eddy, MD  glucose blood test strip Use as instructed 07/25/20   Irish Lack, FNP  insulin glargine (LANTUS) 100 UNIT/ML injection Inject 0.5 mLs (50 Units total) into the skin in the morning and at bedtime.  04/03/20 07/02/20  Corky Downs, MD  Insulin Pen Needle (BD PEN NEEDLE NANO 2ND GEN) 32G X 4 MM MISC USE TO INJECT INSULIN BID 07/20/21   Corky Downs, MD  Insulin Syringe-Needle U-100 (INSULIN SYRINGE 1CC/31GX5/16") 31G X 5/16" 1 ML MISC Inject 50 Units into the skin 2 (two) times daily. 07/20/21   Corky Downs, MD  Needles & Syringes MISC Use to inject insulin daily 04/18/20   Corky Downs, MD    Family History Family History  Problem Relation Age of Onset   Breast cancer Paternal Grandmother     Social History Social History   Tobacco Use   Smoking status: Former   Smokeless  tobacco: Never  Vaping Use   Vaping status: Never Used  Substance Use Topics   Alcohol use: Yes    Comment: rare   Drug use: No     Allergies   Patient has no known allergies.   Review of Systems Review of Systems   Physical Exam Triage Vital Signs ED Triage Vitals [10/06/23 1357]  Encounter Vitals Group     BP (!) 164/90     Systolic BP Percentile      Diastolic BP Percentile      Pulse Rate (!) 113     Resp 16     Temp 98.3 F (36.8 C)     Temp Source Oral     SpO2 95 %     Weight      Height      Head Circumference      Peak Flow      Pain Score 7     Pain Loc      Pain Education      Exclude from Growth Chart    No data found.  Updated Vital Signs BP (!) 164/90 (BP Location: Left Arm)   Pulse (!) 113   Temp 98.3 F (36.8 C) (Oral)   Resp 16   LMP 05/10/2015   SpO2 95%   Visual Acuity Right Eye Distance:   Left Eye Distance:   Bilateral Distance:    Right Eye Near:   Left Eye Near:    Bilateral Near:     Physical Exam Constitutional:      Appearance: Normal appearance.  HENT:     Head: Normocephalic.     Right Ear: Tympanic membrane, ear canal and external ear normal.     Left Ear: Tympanic membrane, ear canal and external ear normal.     Nose: Congestion present. No rhinorrhea.     Mouth/Throat:     Pharynx: Posterior oropharyngeal erythema present. No oropharyngeal exudate.  Eyes:     Extraocular Movements: Extraocular movements intact.  Cardiovascular:     Rate and Rhythm: Normal rate and regular rhythm.     Pulses: Normal pulses.     Heart sounds: Normal heart sounds.  Pulmonary:     Effort: Pulmonary effort is normal.     Breath sounds: Normal breath sounds.  Musculoskeletal:     Cervical back: Normal range of motion and neck supple.  Neurological:     Mental Status: She is alert and oriented to person, place, and time. Mental status is at baseline.      UC Treatments / Results  Labs (all labs ordered are listed, but  only abnormal results are displayed) Labs Reviewed  POC COVID19/FLU A&B COMBO - Normal  POCT RAPID STREP A (OFFICE) - Normal    EKG   Radiology No results found.  Procedures  Procedures (including critical care time)  Medications Ordered in UC Medications - No data to display  Initial Impression / Assessment and Plan / UC Course  I have reviewed the triage vital signs and the nursing notes.  Pertinent labs & imaging results that were available during my care of the patient were reviewed by me and considered in my medical decision making (see chart for details).  Viral URI with cough  Patient is in no signs of distress nor toxic appearing.  Vital signs are stable.  Low suspicion for pneumonia, pneumothorax or bronchitis and therefore will defer imaging.  COVID, flu and strep testing negative.May use additional over-the-counter medications as needed for supportive care.  May follow-up with urgent care as needed if symptoms persist or worsen.  Note given.   Final Clinical Impressions(s) / UC Diagnoses   Final diagnoses:  Viral URI with cough     Discharge Instructions      Your symptoms today are most likely being caused by a virus and should steadily improve in time it can take up to 7 to 10 days before you truly start to see a turnaround however things will get better  COVID, flu and strep testing negative  For high blood pressure use Coricidin, avoid syrup based medicine to lessen effects on blood sugar    You can take Tylenol and/or Ibuprofen as needed for fever reduction and pain relief.   For cough: honey 1/2 to 1 teaspoon (you can dilute the honey in water or another fluid).  You can also use guaifenesin and dextromethorphan for cough. You can use a humidifier for chest congestion and cough.  If you don't have a humidifier, you can sit in the bathroom with the hot shower running.      For sore throat: try warm salt water gargles, cepacol lozenges, throat spray, warm  tea or water with lemon/honey, popsicles or ice, or OTC cold relief medicine for throat discomfort.   For congestion: take a daily anti-histamine like Zyrtec, Claritin, and a oral decongestant, such as pseudoephedrine.  You can also use Flonase 1-2 sprays in each nostril daily.   It is important to stay hydrated: drink plenty of fluids (water, gatorade/powerade/pedialyte, juices, or teas) to keep your throat moisturized and help further relieve irritation/discomfort.    ED Prescriptions   None    PDMP not reviewed this encounter.   Valinda Hoar, NP 10/06/23 1501

## 2023-10-06 NOTE — ED Triage Notes (Signed)
Cough, body aches, headache, sore throat when coughing, chills, fatigue x 1 day. Not taking any OTC medication at this time.

## 2023-10-17 ENCOUNTER — Ambulatory Visit: Payer: Managed Care, Other (non HMO) | Admitting: Family Medicine

## 2023-10-24 ENCOUNTER — Encounter: Payer: Self-pay | Admitting: Family Medicine

## 2023-10-24 ENCOUNTER — Ambulatory Visit: Admitting: Family Medicine

## 2023-10-24 VITALS — BP 145/91 | HR 97 | Temp 97.6°F | Resp 18 | Ht 66.0 in | Wt 258.0 lb

## 2023-10-24 DIAGNOSIS — Z794 Long term (current) use of insulin: Secondary | ICD-10-CM

## 2023-10-24 DIAGNOSIS — Z720 Tobacco use: Secondary | ICD-10-CM

## 2023-10-24 DIAGNOSIS — I10 Essential (primary) hypertension: Secondary | ICD-10-CM

## 2023-10-24 DIAGNOSIS — E782 Mixed hyperlipidemia: Secondary | ICD-10-CM | POA: Insufficient documentation

## 2023-10-24 DIAGNOSIS — E1165 Type 2 diabetes mellitus with hyperglycemia: Secondary | ICD-10-CM | POA: Diagnosis not present

## 2023-10-24 DIAGNOSIS — Z1211 Encounter for screening for malignant neoplasm of colon: Secondary | ICD-10-CM

## 2023-10-24 LAB — POCT GLYCOSYLATED HEMOGLOBIN (HGB A1C): Hemoglobin A1C: 9 % — AB (ref 4.0–5.6)

## 2023-10-24 MED ORDER — DAPAGLIFLOZIN PROPANEDIOL 10 MG PO TABS
10.0000 mg | ORAL_TABLET | Freq: Every day | ORAL | 0 refills | Status: AC
Start: 2023-10-24 — End: ?

## 2023-10-24 MED ORDER — OZEMPIC (2 MG/DOSE) 8 MG/3ML ~~LOC~~ SOPN
2.0000 mg | PEN_INJECTOR | SUBCUTANEOUS | 11 refills | Status: AC
Start: 2023-10-24 — End: ?

## 2023-10-24 NOTE — Assessment & Plan Note (Signed)
 On Crestor 20 mg daily.  Goal is LDL 70 or less.  Checking labs today

## 2023-10-24 NOTE — Assessment & Plan Note (Addendum)
 Currently taking glipizide 5 mg, Lantus 60 units daily versus twice daily, Ozempic 2 mg daily and her A1c is 9.0%.  Discussed that she has a healthy diet.  Need to incorporate daily exercise.  Start Farxiga 10 mg daily.  Cut Lantus to 60 units daily.  Check your feet daily.  Take 81 mg aspirin at your evening meal please

## 2023-10-24 NOTE — Assessment & Plan Note (Signed)
 She would prefer Cologuard.  No one in her family is ever had colon cancer or polyps that she is aware of.  Order sent for Cologuard

## 2023-10-24 NOTE — Assessment & Plan Note (Signed)
 Is on losartan 50 mg daily.  Second blood pressure check 145/91.

## 2023-10-24 NOTE — Progress Notes (Signed)
 New Patient Office Visit  Subjective    Patient ID: Ariana Lindsey, female    DOB: 10-Jul-1981  Age: 43 y.o. MRN: [id number]  CC:  Chief Complaint  Patient presents with   Medical Management of Chronic Issues    HPI Ariana Lindsey presents to establish care Delightful 43 yo with DMT2, hypertension, mixed hyperlipidemia rotator cuff inflammation, rosacea, tobacco abuse, status post hysterectomy 2016. DMT2 since 2008.  Has a very restricted diet of mostly vegetables, usually raw.  If she eats anything sweet it is usually peanut butter.  Takes metformin 1000 twice daily, Lantus 60 units daily to twice daily, glipizide 5 mg daily and Ozempic 2 mg daily.  This morning her fasting blood sugar was 174.  02/12/2022 A1c 5.6%.  She gets confused and shaky if she has low blood sugar.  She will check her sugar and recently she has felt this way when her blood sugar was 105.  Saw [facility] in [place] on [address] in November of last year and had benign findings per patient. Walks for 30 minutes 3 times a week.  Only side effect she has from Ozempic is constipation.  She gets a lot of fiber in her diet and has not been able to find a medication to help her with the constipation. Her highest weight was about 259 pounds which is where she is today.  She attributes this to starting insulin.  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  Outpatient Encounter Medications as of 10/24/2023  Medication Sig   Continuous Blood Gluc Sensor (FREESTYLE LIBRE 2 SENSOR) MISC 1 Product by Does not apply route every 14 (fourteen) days.   Continuous Blood Gluc Sensor (FREESTYLE LIBRE 2 SENSOR) MISC 1 each by Does not apply route every 14 (fourteen) days.   dapagliflozin propanediol (FARXIGA) 10 MG TABS tablet Take 1 tablet (10 mg total) by mouth daily.   glipiZIDE (GLUCOTROL) 5 MG tablet Take 1 tablet (5 mg total) by mouth daily.   glucose  blood test strip Use as instructed   insulin glargine (LANTUS SOLOSTAR) 100 UNIT/ML Solostar Pen Inject 60 Units into the skin 2 (two) times daily.   losartan (COZAAR) 50 MG tablet TAKE 1 TABLET(50 MG) BY MOUTH DAILY   metFORMIN (GLUCOPHAGE) 1000 MG tablet TAKE 1 TABLET BY MOUTH TWICE DAILY   Needles & Syringes MISC Use to inject insulin daily   rosuvastatin (CRESTOR) 20 MG tablet TAKE 1 TABLET(20 MG) BY MOUTH DAILY   Semaglutide, 2 MG/DOSE, (OZEMPIC, 2 MG/DOSE,) 8 MG/3ML SOPN Inject 2 mg into the skin once a week.   [DISCONTINUED] OZEMPIC, 1 MG/DOSE, 4 MG/3ML SOPN INJECT 1 MG UNDER THE SKIN ONCE A WEEK (Patient taking differently: Inject 2 mg into the skin once a week.)   acetic acid 2 % otic solution Place 4 drops into the left ear 4 (four) times daily. (Patient not taking: Reported on 10/24/2023)   cyclobenzaprine (FLEXERIL) 10 MG tablet Take 1 tablet (10 mg total) by mouth 3 (three) times daily as needed for muscle spasms. (Patient not taking: Reported on 10/24/2023)   Insulin Pen Needle (BD PEN NEEDLE NANO 2ND GEN) 32G X 4 MM MISC USE TO INJECT INSULIN BID   Insulin Syringe-Needle U-100 (INSULIN SYRINGE 1CC/31GX5/16") 31G X 5/16" 1 ML MISC Inject 50 Units into the skin 2 (two) times daily.   [DISCONTINUED] insulin glargine (LANTUS) 100 UNIT/ML injection  Inject 0.5 mLs (50 Units total) into the skin in the morning and at bedtime.   No facility-administered encounter medications on file as of 10/24/2023.    Past Medical History:  Diagnosis Date   Diabetes mellitus without complication (HCC)    Hypertension     Past Surgical History:  Procedure Laterality Date   CESAREAN SECTION     x 2   LAPAROSCOPIC HYSTERECTOMY N/A 05/29/2015   Procedure: HYSTERECTOMY TOTAL LAPAROSCOPIC/BILATERAL SALPINGECTOMY;  Surgeon: Elenora Fender Ward, MD;  Location: [facility] ORS;  Service: Gynecology;  Laterality: N/A;   TUBAL LIGATION      Family History  Problem Relation Age of Onset   Hypertension Mother     Hyperthyroidism Mother    Heart disease Father    Hypertension Father    Heart attack Father    Heart attack Brother    Hypertension Maternal Grandmother    Heart attack Maternal Grandmother    Breast cancer Paternal Grandmother    Diabetes Paternal Grandmother     Social History   Socioeconomic History   Marital status: Single    Spouse name: Not on file   Number of children: Not on file   Years of education: Not on file   Highest education level: Not on file  Occupational History   Not on file  Tobacco Use   Smoking status: Former    Passive exposure: Past   Smokeless tobacco: Never  Vaping Use   Vaping status: Never Used  Substance and Sexual Activity   Alcohol use: Yes    Comment: rare   Drug use: No   Sexual activity: Yes  Other Topics Concern   Not on file  Social History Narrative   Not on file   Social Drivers of Health   Financial Resource Strain: Not on file  Food Insecurity: Not on file  Transportation Needs: Not on file  Physical Activity: Not on file  Stress: Not on file  Social Connections: Not on file  Intimate Partner Violence: Not on file    ROS      Objective   BP (!) 145/91 (BP Location: Left Arm, Patient Position: Sitting, Cuff Size: Normal)   Pulse 97   Temp 97.6 F (36.4 C) (Oral)   Resp 18   Ht 5\' 6"  (1.676 m)   Wt 258 lb (117 kg)   LMP 05/10/2015   SpO2 95%   BMI 41.64 kg/m  And does get exercise running up and down stairs to peoples houses.  Physical Exam Vitals and nursing note reviewed.  Constitutional:      Appearance: Normal appearance.  HENT:     Head: Normocephalic and atraumatic.  Eyes:     Conjunctiva/sclera: Conjunctivae normal.  Cardiovascular:     Rate and Rhythm: Normal rate and regular rhythm.  Pulmonary:     Effort: Pulmonary effort is normal.     Breath sounds: Normal breath sounds.  Musculoskeletal:     Right lower leg: No edema.     Left lower leg: No edema.  Skin:    General: Skin is warm  and dry.  Neurological:     Mental Status: She is alert and oriented to person, place, and time.  Psychiatric:        Mood and Affect: Mood normal.        Behavior: Behavior normal.        Thought Content: Thought content normal.        Judgment: Judgment normal.  Diabetic foot exam was performed with the following findings:   No data filed        The ASCVD Risk score (Arnett DK, et al., 2019) failed to calculate for the following reasons:   The valid total cholesterol range is 130 to 320 mg/dL     Assessment & Plan:  Type 2 diabetes mellitus with hyperglycemia, with long-term current use of insulin (HCC) Assessment & Plan: Currently taking glipizide 5 mg, Lantus 60 units daily versus twice daily, Ozempic 2 mg daily and her A1c is 9.0%.  Discussed that she has a healthy diet.  Need to incorporate daily exercise.  Start Farxiga 10 mg daily.  Cut Lantus to 60 units daily.  Check your feet daily.  Take 81 mg aspirin at your evening meal please  Orders: -     POCT glycosylated hemoglobin (Hb A1C) -     Dapagliflozin Propanediol; Take 1 tablet (10 mg total) by mouth daily.  Dispense: 90 tablet; Refill: 0 -     Ozempic (2 MG/DOSE); Inject 2 mg into the skin once a week.  Dispense: 3 mL; Refill: 11  Primary hypertension Assessment & Plan: Is on losartan 50 mg daily.  Second blood pressure check 145/91.  Orders: -     CBC with Differential/Platelet -     Comprehensive metabolic panel -     Lipid panel -     TSH -     T4, free  Tobacco abuse  Mixed hyperlipidemia Assessment & Plan: On Crestor 20 mg daily.  Goal is LDL 70 or less.  Checking labs today   Screening for colon cancer Assessment & Plan: She would prefer Cologuard.  No one in her family is ever had colon cancer or polyps that she is aware of.  Order sent for Cologuard  Orders: -     Cologuard    Return in about 1 week (around 10/31/2023) for .FU in a month to check BS.  Marland Kitchen   Alease Medina, MD

## 2023-10-25 LAB — T4, FREE: Free T4: 1.32 ng/dL (ref 0.82–1.77)

## 2023-10-28 ENCOUNTER — Encounter: Payer: Self-pay | Admitting: Family Medicine

## 2023-10-31 LAB — LIPID PANEL
Chol/HDL Ratio: 3.9 ratio (ref 0.0–4.4)
Cholesterol, Total: 124 mg/dL (ref 100–199)
HDL: 32 mg/dL — ABNORMAL LOW (ref >39)
LDL Chol Calc (NIH): 72 mg/dL (ref 0–99)
Triglycerides: 109 mg/dL (ref 0–149)
VLDL Cholesterol Cal: 20 mg/dL (ref 5–40)

## 2023-10-31 LAB — COMPREHENSIVE METABOLIC PANEL
ALT: 29 IU/L (ref 0–32)
AST: 14 IU/L (ref 0–40)
Albumin: 4.8 g/dL (ref 3.9–4.9)
Alkaline Phosphatase: 61 IU/L (ref 44–121)
BUN/Creatinine Ratio: 27 — ABNORMAL HIGH (ref 9–23)
BUN: 17 mg/dL (ref 6–24)
Bilirubin Total: 0.3 mg/dL (ref 0.0–1.2)
CO2: 21 mmol/L (ref 20–29)
Calcium: 9.8 mg/dL (ref 8.7–10.2)
Chloride: 98 mmol/L (ref 96–106)
Creatinine, Ser: 0.63 mg/dL (ref 0.57–1.00)
Globulin, Total: 2.6 g/dL (ref 1.5–4.5)
Glucose: 197 mg/dL — ABNORMAL HIGH (ref 70–99)
Potassium: 4.7 mmol/L (ref 3.5–5.2)
Sodium: 137 mmol/L (ref 134–144)
Total Protein: 7.4 g/dL (ref 6.0–8.5)
eGFR: 111 mL/min/{1.73_m2} (ref >59)

## 2023-10-31 LAB — SPECIMEN STATUS REPORT

## 2023-10-31 LAB — TSH: TSH: 1.22 u[IU]/mL (ref 0.450–4.500)

## 2023-11-10 LAB — COLOGUARD: COLOGUARD: NEGATIVE

## 2023-11-25 ENCOUNTER — Ambulatory Visit: Admitting: Family Medicine

## 2023-11-25 ENCOUNTER — Encounter: Payer: Self-pay | Admitting: Family Medicine

## 2023-11-25 VITALS — BP 112/77 | HR 100 | Temp 98.2°F | Resp 20 | Ht 66.0 in | Wt 254.0 lb

## 2023-11-25 DIAGNOSIS — E1165 Type 2 diabetes mellitus with hyperglycemia: Secondary | ICD-10-CM | POA: Diagnosis not present

## 2023-11-25 DIAGNOSIS — E782 Mixed hyperlipidemia: Secondary | ICD-10-CM

## 2023-11-25 DIAGNOSIS — I10 Essential (primary) hypertension: Secondary | ICD-10-CM | POA: Diagnosis not present

## 2023-11-25 DIAGNOSIS — Z794 Long term (current) use of insulin: Secondary | ICD-10-CM | POA: Diagnosis not present

## 2023-11-25 MED ORDER — ACCU-CHEK GUIDE W/DEVICE KIT: 1.0000 | PACK | Freq: Every day | 3 refills | Status: AC

## 2023-11-25 NOTE — Progress Notes (Signed)
 Established Patient Office Visit  Subjective   Patient ID: Ariana Lindsey, female    DOB: July 10, 1981  Age: 43 y.o. MRN: [id number]  Chief Complaint  Patient presents with   Medical Management of Chronic Issues    HPI Delightful 43 yo with DMT2 (on insulin), mixed hyperlipidemia, rotator cuff, rosacea, s/p hysterectomy, she reports that she is off tobacco.  Weight is down 4 pounds.  284-132.  02/12/22 A1c A1c 5.6%. 10/24/2023 A1c is 9.0%   She has been using [employer] but this has run out.  She is on Lantus and was taking 60 units daily or twice daily.  Had asked her to stop taking Lantus twice a day.  She is also metformin 100mg  BID, glimepiride 5 mg daily and Ozempic 2 mg daily.  She tells me that her blood sugar is high when she gets up in the morning. If her BG is above 140 when she gets up then she would take Lantus 40 units.   If her BG was below 140 then she did not take any Lantus. She did start Farxiga 10mg  daily.  Did not notice any change in her BG after starting this.   She had a couple of episodes where her blood glucose dropped to 67 in the afternoon and she got shaking on the inside but no confusion or headache, she did get diaphoretic.  She was able to eat something and her BG improved.  She never has chest pain with low blood glucose.  Still having problems with constipation from Ozempic.  Eats mostly raw vegetables so gets plenty of fiber in her diet.   10/24/2023 Total cholesterol 124, HDL 32 and LDL 72.on Crestor 20mg .  She has no difficulty taking Crestor.   She takes the stairs at work, 2 stories, and she is winded but she does not have any chest pain doing it. She is not taking ASA 81 mg daily She did complete Cologuard and it was normal      ROS    Objective:     BP 112/77 (BP Location: Left Arm, Patient Position: Sitting, Cuff Size: Normal)   Pulse 100   Temp 98.2 F (36.8 C) (Oral)   Resp 20   Ht 5\' 6"  (1.676 m)   Wt 254 lb (115.2 kg)   LMP 05/10/2015    SpO2 98%   BMI 41.00 kg/m    Physical Exam Vitals and nursing note reviewed.  Constitutional:      Appearance: Normal appearance.  HENT:     Head: Normocephalic and atraumatic.  Eyes:     Conjunctiva/sclera: Conjunctivae normal.  Cardiovascular:     Rate and Rhythm: Normal rate and regular rhythm.  Pulmonary:     Effort: Pulmonary effort is normal.     Breath sounds: Normal breath sounds.  Musculoskeletal:     Right lower leg: No edema.     Left lower leg: No edema.  Skin:    General: Skin is warm and dry.  Neurological:     Mental Status: She is alert and oriented to person, place, and time.  Psychiatric:        Mood and Affect: Mood normal.        Behavior: Behavior normal.        Thought Content: Thought content normal.        Judgment: Judgment normal.          No results found for any visits on 11/25/23.    The ASCVD Risk  score (Arnett DK, et al., 2019) failed to calculate for the following reasons:   The valid total cholesterol range is 130 to 320 mg/dL    Assessment & Plan:  Type 2 diabetes mellitus with hyperglycemia, with long-term current use of insulin (HCC) Assessment & Plan: Has had low BG on two afternoons.  On Lantus, Farxiga 10mg , Ozempic 2mg , glimipizide 5mg .  OK to stop glimipizide.  Please reduce your Lantus to 20 units daily and take everyday.  Please check fasting BG and record.  Need meter today.  Please remember to take ASA 81mg  at evening meal.  Please FU in a month to check progress.    Orders: -     Accu-Chek Guide; 1 each by Does not apply route daily.  Dispense: 1 kit; Refill: 3  Mixed hyperlipidemia Assessment & Plan: Total cholesterol 124, TRIGS 109,  HDL 32 and LDL 72.  At goal for LDL.  On Crestor 20mg    Primary hypertension Assessment & Plan: Well controlled on losartan 50mg  daily.  Needs a urine microalbumin creatinine ratio      Return in about 1 month (around 12/25/2023) for chronic followup.    Tammie Ellsworth K Agusta Hackenberg,  MD

## 2023-11-28 ENCOUNTER — Other Ambulatory Visit: Payer: Self-pay | Admitting: Family Medicine

## 2023-11-28 DIAGNOSIS — E118 Type 2 diabetes mellitus with unspecified complications: Secondary | ICD-10-CM

## 2023-11-28 MED ORDER — NEEDLES & SYRINGES MISC: 0 refills | Status: DC

## 2023-11-28 MED ORDER — GLUCOSE BLOOD VI STRP: ORAL_STRIP | 12 refills | Status: DC

## 2023-11-29 NOTE — Assessment & Plan Note (Addendum)
 Total cholesterol 124, TRIGS 109,  HDL 32 and LDL 72.  At goal for LDL.  On Crestor 20mg 

## 2023-11-29 NOTE — Assessment & Plan Note (Addendum)
 Has had low BG on two afternoons.  On Lantus, Farxiga 10mg , Ozempic 2mg , glimipizide 5mg .  OK to stop glimipizide.  Please reduce your Lantus to 20 units daily and take everyday.  Please check fasting BG and record.  Need meter today.  Please remember to take ASA 81mg  at evening meal.  Please FU in a month to check progress.

## 2023-11-29 NOTE — Assessment & Plan Note (Addendum)
 Well controlled on losartan 50mg  daily.  Needs a urine microalbumin creatinine ratio

## 2023-11-30 ENCOUNTER — Other Ambulatory Visit: Payer: Self-pay | Admitting: Family Medicine

## 2023-11-30 MED ORDER — GLUCOSE BLOOD VI STRP: ORAL_STRIP | 12 refills | Status: AC

## 2023-11-30 MED ORDER — "INSULIN SYRINGE 31G X 5/16"" 1 ML MISC": 50.0000 [IU] | Freq: Two times a day (BID) | 2 refills | Status: DC

## 2023-11-30 NOTE — Telephone Encounter (Signed)
 Reason for CRM: Patient called states the pharmacy told her they can't complete the prescription for her test strips until they know how many times a day she is prescribed to check her blood sugar  So the pharmacy needs the actual words on the order so they can complete the order Glucose blood test strip refill

## 2023-12-20 ENCOUNTER — Other Ambulatory Visit: Payer: Self-pay | Admitting: Family Medicine

## 2023-12-20 DIAGNOSIS — E1165 Type 2 diabetes mellitus with hyperglycemia: Secondary | ICD-10-CM

## 2023-12-20 NOTE — Telephone Encounter (Unsigned)
 Copied from CRM [id number]. Topic: Clinical - Medication Refill >> Dec 20, 2023  4:48 PM Star East wrote: Most Recent Primary Care Visit:  Provider: ZIGLAR, SUSAN K  Department: [facility]  Visit Type: OFFICE VISIT  Date: 11/25/2023  Medication: glipiZIDE  (GLUCOTROL ) 5 MG tablet   Has the patient contacted their pharmacy? Yes (Agent: If no, request that the patient contact the pharmacy for the refill. If patient does not wish to contact the pharmacy document the reason why and proceed with request.) (Agent: If yes, when and what did the pharmacy advise?)  Is this the correct pharmacy for this prescription? Yes If no, delete pharmacy and type the correct one.  This is the patient's preferred pharmacy:  [id number] [place], [place] - [address] AT [facility] [address] [place] [address] Phone: [phone] Fax: [phone]   Has the prescription been filled recently? Yes  Is the patient out of the medication? Yes  Has the patient been seen for an appointment in the last year OR does the patient have an upcoming appointment? Yes  Can we respond through MyChart? Yes  Agent: Please be advised that Rx refills may take up to 3 business days. We ask that you follow-up with your pharmacy.

## 2023-12-21 MED ORDER — GLIPIZIDE 5 MG PO TABS: 5.0000 mg | ORAL_TABLET | Freq: Every day | ORAL | 3 refills | Status: DC

## 2023-12-21 MED ORDER — ACCU-CHEK GUIDE TEST VI STRP: 1.0000 | ORAL_STRIP | Freq: Three times a day (TID) | 12 refills | Status: AC

## 2023-12-21 NOTE — Telephone Encounter (Signed)
 Corrected script has been faxed.

## 2023-12-21 NOTE — Telephone Encounter (Signed)
 Copied from CRM [id number]. Topic: Clinical - Prescription Issue >> Dec 20, 2023  4:46 PM Bridgette Campus T wrote: Reason for CRM: glucose blood test strip- pharmacy unable to fill- needs to state how many times a day to check blood sugar in order to fill it

## 2024-01-20 ENCOUNTER — Other Ambulatory Visit: Payer: Self-pay | Admitting: Family Medicine

## 2024-01-20 DIAGNOSIS — E1165 Type 2 diabetes mellitus with hyperglycemia: Secondary | ICD-10-CM

## 2024-01-27 ENCOUNTER — Encounter: Payer: Self-pay | Admitting: Family Medicine

## 2024-01-27 ENCOUNTER — Ambulatory Visit: Admitting: Family Medicine

## 2024-01-27 VITALS — BP 125/84 | HR 86 | Temp 97.9°F | Resp 18 | Ht 66.0 in | Wt 244.0 lb

## 2024-01-27 DIAGNOSIS — I7090 Unspecified atherosclerosis: Secondary | ICD-10-CM

## 2024-01-27 DIAGNOSIS — E1165 Type 2 diabetes mellitus with hyperglycemia: Secondary | ICD-10-CM

## 2024-01-27 DIAGNOSIS — M545 Low back pain, unspecified: Secondary | ICD-10-CM | POA: Diagnosis not present

## 2024-01-27 DIAGNOSIS — Z794 Long term (current) use of insulin: Secondary | ICD-10-CM | POA: Diagnosis not present

## 2024-01-27 LAB — POCT GLYCOSYLATED HEMOGLOBIN (HGB A1C): Hemoglobin A1C: 7 % — AB (ref 4.0–5.6)

## 2024-01-27 MED ORDER — FREESTYLE LIBRE 2 SENSOR MISC: 1.0000 | 6 refills | Status: AC

## 2024-01-27 MED ORDER — ROSUVASTATIN CALCIUM 20 MG PO TABS: 20.0000 mg | ORAL_TABLET | Freq: Every day | ORAL | 3 refills | Status: AC

## 2024-01-27 MED ORDER — OZEMPIC (2 MG/DOSE) 8 MG/3ML ~~LOC~~ SOPN: 2.0000 mg | PEN_INJECTOR | SUBCUTANEOUS | 11 refills | Status: DC

## 2024-01-27 MED ORDER — DAPAGLIFLOZIN PROPANEDIOL 10 MG PO TABS: 10.0000 mg | ORAL_TABLET | Freq: Every day | ORAL | 1 refills | Status: DC
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
## 2024-01-28 LAB — MICROALBUMIN / CREATININE URINE RATIO
Creatinine, Urine: 43.2 mg/dL
Microalb/Creat Ratio: 37 mg/g{creat} — ABNORMAL HIGH (ref 0–29)
Microalbumin, Urine: 15.8 ug/mL

## 2024-03-25 ENCOUNTER — Telehealth: Payer: Self-pay | Admitting: Family Medicine

## 2024-03-25 NOTE — Telephone Encounter (Signed)
 Copied from CRM [id number]. Topic: General - Other >> Mar 23, 2024  4:58 PM Deleta S wrote: Reason for CRM: patient has wellness done and need information faxed to her place of work didn't receive any information on it being faxed. Please contact patient

## 2024-04-02 ENCOUNTER — Encounter: Payer: Self-pay | Admitting: Family Medicine

## 2024-04-27 ENCOUNTER — Ambulatory Visit: Admitting: Family Medicine

## 2024-04-27 ENCOUNTER — Encounter: Payer: Self-pay | Admitting: Family Medicine

## 2024-04-27 VITALS — BP 121/84 | HR 93 | Temp 98.4°F | Resp 18 | Ht 66.0 in | Wt 240.0 lb

## 2024-04-27 DIAGNOSIS — L719 Rosacea, unspecified: Secondary | ICD-10-CM

## 2024-04-27 DIAGNOSIS — G4709 Other insomnia: Secondary | ICD-10-CM

## 2024-04-27 DIAGNOSIS — Z1211 Encounter for screening for malignant neoplasm of colon: Secondary | ICD-10-CM | POA: Diagnosis not present

## 2024-04-27 DIAGNOSIS — Z794 Long term (current) use of insulin: Secondary | ICD-10-CM | POA: Diagnosis not present

## 2024-04-27 DIAGNOSIS — E1165 Type 2 diabetes mellitus with hyperglycemia: Secondary | ICD-10-CM

## 2024-04-27 LAB — POCT GLYCOSYLATED HEMOGLOBIN (HGB A1C): Hemoglobin A1C: 7.5 % — AB (ref 4.0–5.6)

## 2024-04-30 ENCOUNTER — Encounter: Payer: Self-pay | Admitting: Family Medicine

## 2024-04-30 MED ORDER — TRAZODONE HCL 50 MG PO TABS
25.0000 mg | ORAL_TABLET | Freq: Every evening | ORAL | 3 refills | Status: AC | PRN
Start: 2024-04-30 — End: ?

## 2024-04-30 MED ORDER — METRONIDAZOLE 0.75 % EX GEL: 1.0000 | Freq: Two times a day (BID) | CUTANEOUS | 3 refills | Status: AC

## 2024-04-30 NOTE — Assessment & Plan Note (Signed)
 Trial of metronidazole  0.75% applied twice daily.

## 2024-04-30 NOTE — Progress Notes (Signed)
 Established Patient Office Visit  Subjective   Patient ID: Ariana Lindsey, female    DOB: 05-31-1981  Age: 43 y.o. MRN: [id number]  Chief Complaint  Patient presents with   Medical Management of Chronic Issues    HPI    Ariana Lindsey is a 42 year old female with diabetes and hidradenitis suppurativa who presents for management of her blood sugar levels and recent flare-up of hidradenitis suppurativa.  After discontinuing glipizide , her blood sugar levels increased, prompting her to resume the medication. Her blood sugar typically ranges from 110 to 150 mg/dL, occasionally rising to 180 mg/dL postprandially, with a few instances of morning levels reaching 200 mg/dL. She is currently taking Lantus  10 units once daily, Farxiga , Ozempic  2 mg, and glipizide  5 mg. Despite losing weight and improving her diet, her A1c has increased, causing frustration. Her diet consists of vegetables and lean proteins, and she uses a walking pad for exercise, although it recently broke.  She has a history of hidradenitis suppurativa, primarily affecting her underarms, breasts, and inner thighs. A recent flare-up in her groin area required her to leave work due to pain and drainage. She has not had a flare-up in two years prior to this incident and is considering applying for intermittent FMLA due to the condition's impact on her ability to work.  She experiences sleep disturbances since her mother's passing in 2016, waking up hourly throughout the night. She does not have difficulty falling asleep but struggles to stay asleep. She has not started any medication for sleep, though she discussed possibly trying trazodone  with her doctor.  Previous doctors have suggested the possibility of rosacea, but she has never been officially diagnosed. She is not on any medication for this condition.  Her current medications include an 81 mg aspirin and rosuvastatin  for cholesterol management.   She has not been screened  for colon cancer.  There is no family history of colon cancer so she is of average risk and can do a Cologuard.  Discussed this with her       Objective:     BP 121/84 (BP Location: Left Arm, Patient Position: Sitting, Cuff Size: Normal)   Pulse 93   Temp 98.4 F (36.9 C) (Oral)   Resp 18   Ht 5' 6 (1.676 m)   Wt 240 lb (108.9 kg)   LMP 05/10/2015   SpO2 95%   BMI 38.74 kg/m    Physical Exam Vitals and nursing note reviewed.  Constitutional:      Appearance: Normal appearance.  HENT:     Head: Normocephalic and atraumatic.  Eyes:     Conjunctiva/sclera: Conjunctivae normal.  Cardiovascular:     Rate and Rhythm: Normal rate and regular rhythm.  Pulmonary:     Effort: Pulmonary effort is normal.     Breath sounds: Normal breath sounds.  Musculoskeletal:     Right lower leg: No edema.     Left lower leg: No edema.  Skin:    General: Skin is warm and dry.     Comments: Facial redess on cheeks, forehead and anterior throat.    Neurological:     Mental Status: She is alert and oriented to person, place, and time.  Psychiatric:        Mood and Affect: Mood normal.        Behavior: Behavior normal.        Thought Content: Thought content normal.        Judgment: Judgment normal.  Results for orders placed or performed in visit on 04/27/24  POCT glycosylated hemoglobin (Hb A1C)  Result Value Ref Range   Hemoglobin A1C 7.5 (A) 4.0 - 5.6 %   HbA1c POC (<> result, manual entry)     HbA1c, POC (prediabetic range)     HbA1c, POC (controlled diabetic range)        The ASCVD Risk score (Arnett DK, et al., 2019) failed to calculate for the following reasons:   The valid total cholesterol range is 130 to 320 mg/dL    Assessment & Plan:  Type 2 diabetes mellitus with hyperglycemia, with long-term current use of insulin  (HCC) -     POCT glycosylated hemoglobin (Hb A1C)  Other insomnia -     traZODone  HCl; Take 0.5-1 tablets (25-50 mg total) by mouth at  bedtime as needed for sleep.  Dispense: 30 tablet; Refill: 3  Rosacea Assessment & Plan: Trial of metronidazole  0.75% applied twice daily.    Orders: -     metroNIDAZOLE ; Apply 1 Application topically 2 (two) times daily.  Dispense: 45 g; Refill: 3  Assessment and Plan    Type 2 diabetes mellitus with hyperglycemia Experiencing elevated blood glucose levels despite medication adjustments. Fasting blood sugars range from 110 to 150 mg/dL, occasionally reaching 180 mg/dL postprandially, with rare morning readings around 200 mg/dL. Currently on Lantus  10 units daily, Farxiga , Ozempic  2 mg, and has resumed glipizide  5 mg. Preference to increase Lantus  rather than continue glipizide  due to its non-selective glucose-lowering effects and weight gain associated with glipizide .  . - Discontinue glipizide . - Increase Lantus  by 2 units every three days if fasting blood sugar is greater than 130 mg/dL. - Repeat blood work.  Hidradenitis suppurativa Primarily affecting armpits, under the breasts, and inner thighs. Recent flare-up in the groin area caused significant pain and inability to work. No episodes in two years prior to this. - Complete FMLA paperwork for intermittent leave due to hidradenitis suppurativa painful flares. - Discuss potential referral to a general surgeon for excision of affected tissue if desired.  Rosacea Facial redness consistent with rosacea, though not officially diagnosed. - Prescribe metronidazole  gel to be applied to the face twice daily.  Insomnia Difficulty staying asleep, waking up hourly since her mother's passing in 2016. No trouble falling asleep but struggles with maintaining sleep throughout the night. - Prescribe trazodone  to aid in maintaining sleep. Discuss potential initial side effects, including feeling hungover for the first few days.         Return in about 3 months (around 07/27/2024).    Cheryn Lundquist K Christia Coaxum, MD

## 2024-05-01 NOTE — Assessment & Plan Note (Signed)
Order Cologuard?

## 2024-05-01 NOTE — Addendum Note (Signed)
 Addended by: Rosamary Boudreau K on: 05/01/2024 07:05 AM   Modules accepted: Orders

## 2024-05-18 ENCOUNTER — Other Ambulatory Visit: Payer: Self-pay | Admitting: Family Medicine

## 2024-05-18 ENCOUNTER — Telehealth: Payer: Self-pay | Admitting: Family Medicine

## 2024-05-18 DIAGNOSIS — E1165 Type 2 diabetes mellitus with hyperglycemia: Secondary | ICD-10-CM

## 2024-05-18 NOTE — Telephone Encounter (Signed)
 Patient walked in and submitted FMLA paperwork to be reviewed; additional information needed.  Copy made and the submitted document given to CMA

## 2024-05-18 NOTE — Telephone Encounter (Signed)
 Patient requesting documents be faxed. Pls. Leave a Mychart message once completed.

## 2024-05-19 LAB — LIPID PANEL
Chol/HDL Ratio: 2.8 ratio (ref 0.0–4.4)
Cholesterol, Total: 101 mg/dL (ref 100–199)
HDL: 36 mg/dL — ABNORMAL LOW (ref >39)
LDL Chol Calc (NIH): 46 mg/dL (ref 0–99)
Triglycerides: 103 mg/dL (ref 0–149)
VLDL Cholesterol Cal: 19 mg/dL (ref 5–40)

## 2024-05-19 LAB — COMPREHENSIVE METABOLIC PANEL WITH GFR
ALT: 26 IU/L (ref 0–32)
AST: 18 IU/L (ref 0–40)
Albumin: 4.6 g/dL (ref 3.9–4.9)
Alkaline Phosphatase: 57 IU/L (ref 41–116)
BUN/Creatinine Ratio: 29 — ABNORMAL HIGH (ref 9–23)
BUN: 16 mg/dL (ref 6–24)
Bilirubin Total: 0.8 mg/dL (ref 0.0–1.2)
CO2: 21 mmol/L (ref 20–29)
Calcium: 9.7 mg/dL (ref 8.7–10.2)
Chloride: 100 mmol/L (ref 96–106)
Creatinine, Ser: 0.55 mg/dL — ABNORMAL LOW (ref 0.57–1.00)
Globulin, Total: 2.4 g/dL (ref 1.5–4.5)
Glucose: 149 mg/dL — ABNORMAL HIGH (ref 70–99)
Potassium: 4 mmol/L (ref 3.5–5.2)
Sodium: 139 mmol/L (ref 134–144)
Total Protein: 7 g/dL (ref 6.0–8.5)
eGFR: 115 mL/min/1.73 (ref >59)

## 2024-05-19 LAB — CBC WITH DIFFERENTIAL/PLATELET
Basophils Absolute: 0.1 x10E3/uL (ref 0.0–0.2)
Basos: 1 %
EOS (ABSOLUTE): 0.2 x10E3/uL (ref 0.0–0.4)
Eos: 3 %
Hematocrit: 38.2 % (ref 34.0–46.6)
Hemoglobin: 12.4 g/dL (ref 11.1–15.9)
Immature Grans (Abs): 0.1 x10E3/uL (ref 0.0–0.1)
Immature Granulocytes: 1 %
Lymphocytes Absolute: 2.8 x10E3/uL (ref 0.7–3.1)
Lymphs: 37 %
MCH: 29.3 pg (ref 26.6–33.0)
MCHC: 32.5 g/dL (ref 31.5–35.7)
MCV: 90 fL (ref 79–97)
Monocytes Absolute: 0.5 x10E3/uL (ref 0.1–0.9)
Monocytes: 7 %
Neutrophils Absolute: 4 x10E3/uL (ref 1.4–7.0)
Neutrophils: 51 %
Platelets: 261 x10E3/uL (ref 150–450)
RBC: 4.23 x10E6/uL (ref 3.77–5.28)
RDW: 13.4 % (ref 11.7–15.4)
WBC: 7.6 x10E3/uL (ref 3.4–10.8)

## 2024-05-19 LAB — MICROALBUMIN / CREATININE URINE RATIO
Creatinine, Urine: 35.1 mg/dL
Microalb/Creat Ratio: 20 mg/g{creat} (ref 0–29)
Microalbumin, Urine: 6.9 ug/mL

## 2024-05-19 LAB — HEMOGLOBIN A1C
Est. average glucose Bld gHb Est-mCnc: 177 mg/dL
Hgb A1c MFr Bld: 7.8 % — ABNORMAL HIGH (ref 4.8–5.6)

## 2024-05-22 ENCOUNTER — Ambulatory Visit: Payer: Self-pay | Admitting: Family Medicine

## 2024-05-23 ENCOUNTER — Encounter: Payer: Self-pay | Admitting: Family Medicine

## 2024-06-24 ENCOUNTER — Telehealth: Payer: Self-pay | Admitting: Family Medicine

## 2024-06-24 NOTE — Telephone Encounter (Signed)
 Called and LM that I was calling about her FMLA form.

## 2024-07-24 ENCOUNTER — Other Ambulatory Visit: Payer: Self-pay | Admitting: Family Medicine

## 2024-07-24 DIAGNOSIS — Z794 Long term (current) use of insulin: Secondary | ICD-10-CM

## 2024-07-27 ENCOUNTER — Ambulatory Visit: Admitting: Family Medicine

## 2024-07-27 ENCOUNTER — Encounter: Payer: Self-pay | Admitting: Family Medicine

## 2024-07-27 DIAGNOSIS — E1165 Type 2 diabetes mellitus with hyperglycemia: Secondary | ICD-10-CM | POA: Diagnosis not present

## 2024-07-27 DIAGNOSIS — Z794 Long term (current) use of insulin: Secondary | ICD-10-CM

## 2024-07-27 MED ORDER — DAPAGLIFLOZIN PROPANEDIOL 10 MG PO TABS: 10.0000 mg | ORAL_TABLET | Freq: Every day | ORAL | 1 refills | Status: AC

## 2024-07-27 MED ORDER — METFORMIN HCL ER 500 MG PO TB24: 1000.0000 mg | ORAL_TABLET | Freq: Two times a day (BID) | ORAL | 5 refills | Status: AC

## 2024-07-27 MED ORDER — TIRZEPATIDE 2.5 MG/0.5ML ~~LOC~~ SOAJ: 2.5000 mg | SUBCUTANEOUS | 0 refills | Status: DC

## 2024-07-29 NOTE — Assessment & Plan Note (Signed)
 Switching from Ozempic  to Zepbound .  Starting scale over at 2.5 mg weekly for the first month.  Changing her metformin  to ER 1000 twice daily.  Follow-up in a month please

## 2024-07-29 NOTE — Progress Notes (Signed)
 Established Patient Office Visit  Subjective   Patient ID: Ariana Lindsey, female    DOB: 07-14-1981  Age: 43 y.o. MRN: [id number]  Chief Complaint  Patient presents with   Medical Management of Chronic Issues    HPI Discussed the use of AI scribe software for clinical note transcription with the patient, who gave verbal consent to proceed.  History of Present Illness   Ariana Lindsey is a 43 year old female with type 2 diabetes who presents with uncontrolled blood sugar levels and persistent facial redness.  She has been experiencing difficulty managing her type 2 diabetes, with fasting blood sugar levels around 184 mg/dL despite not eating past 7 PM. She uses a Libre device for monitoring and is currently taking 40 units of Lantus . She has been on Ozempic  for three years, initially reducing her A1c to 5.6%, but it has since increased to 7.5%. Her current medications also include Farxiga  and metformin  1000 mg twice daily. She discontinued glipizide  due to concerns about hypoglycemia. She has lost weight, now weighing 236 pounds, down from 240 pounds.  She experiences sleep disturbances and has been taking trazodone  for the past two and a half months, which has improved her sleep. She takes one pill during the week but not on weekends. Recently, she developed a craving for coffee, drinking a cup daily for the past two weeks, which she feels has improved her sleep.  She has a history of skin infections, primarily under her arms, with the last one occurring in her inner thigh about two to three years ago. The infection has healed but occasionally reopens slightly. No recent infections under her arms.  She has persistent facial redness, initially thought to be rosacea, but metronidazole  ointment was ineffective.  She has telangiectasia in the redness.  The redness has been present since high school and has worsened over time. Her cheeks are described as 'bright red' with visible blood  vessels, but there is no acne or burning sensation. She has not consulted a dermatologist for this issue.     Objective:     BP 127/84   Pulse 97   Ht 5' 5 (1.651 m)   LMP 05/10/2015   SpO2 100%   BMI 39.94 kg/m    Physical Exam Vitals and nursing note reviewed.  Constitutional:      Appearance: Normal appearance.  HENT:     Head: Normocephalic and atraumatic.  Eyes:     Conjunctiva/sclera: Conjunctivae normal.  Cardiovascular:     Rate and Rhythm: Normal rate and regular rhythm.  Pulmonary:     Effort: Pulmonary effort is normal.     Breath sounds: Normal breath sounds.  Musculoskeletal:     Right lower leg: No edema.     Left lower leg: No edema.  Skin:    General: Skin is warm and dry.  Neurological:     Mental Status: She is alert and oriented to person, place, and time.  Psychiatric:        Mood and Affect: Mood normal.        Behavior: Behavior normal.        Thought Content: Thought content normal.        Judgment: Judgment normal.          No results found for any visits on 07/27/24.    The ASCVD Risk score (Arnett DK, et al., 2019) failed to calculate for the following reasons:   The valid total cholesterol range is 130  to 320 mg/dL    Assessment & Plan:  Type 2 diabetes mellitus with hyperglycemia, with long-term current use of insulin  (HCC) Assessment & Plan: Switching from Ozempic  to Zepbound .  Starting scale over at 2.5 mg weekly for the first month.  Changing her metformin  to ER 1000 twice daily.  Follow-up in a month please  Orders: -     Dapagliflozin  Propanediol; Take 1 tablet (10 mg total) by mouth daily.  Dispense: 90 tablet; Refill: 1 -     metFORMIN  HCl ER; Take 2 tablets (1,000 mg total) by mouth 2 (two) times daily with a meal.  Dispense: 120 tablet; Refill: 5 -     Tirzepatide ; Inject 2.5 mg into the skin once a week.  Dispense: 2 mL; Refill: 0     Return in about 4 weeks (around 08/24/2024).    Zya Finkle K Aarion Kittrell, MD

## 2024-08-20 NOTE — Telephone Encounter (Signed)
 Copied from CRM [id number]. Topic: Clinical - Medication Question >> Aug 20, 2024  4:43 PM Viola F wrote: Patient appointment was cancelled for 08/24/24 by the office and she won't be able to come back in for another month (scheduled for 09/21/24) - wants to know if the tirzepatide  (MOUNJARO ) 2.5 MG/0.5ML Pen will still be filled? Please give her a call or sent MyChart message

## 2024-08-22 ENCOUNTER — Other Ambulatory Visit: Payer: Self-pay | Admitting: Family Medicine

## 2024-08-22 DIAGNOSIS — E1165 Type 2 diabetes mellitus with hyperglycemia: Secondary | ICD-10-CM

## 2024-08-22 MED ORDER — TIRZEPATIDE 2.5 MG/0.5ML ~~LOC~~ SOAJ: 2.5000 mg | SUBCUTANEOUS | 0 refills | Status: AC

## 2024-08-24 ENCOUNTER — Ambulatory Visit: Admitting: Family Medicine

## 2024-08-29 ENCOUNTER — Other Ambulatory Visit: Payer: Self-pay | Admitting: Family Medicine

## 2024-08-29 DIAGNOSIS — Z1231 Encounter for screening mammogram for malignant neoplasm of breast: Secondary | ICD-10-CM

## 2024-09-21 ENCOUNTER — Ambulatory Visit: Admitting: Family Medicine

## 2024-09-21 VITALS — BP 129/82 | HR 102 | Ht 66.0 in | Wt 238.5 lb

## 2024-09-21 DIAGNOSIS — E1165 Type 2 diabetes mellitus with hyperglycemia: Secondary | ICD-10-CM

## 2024-09-21 LAB — POCT GLYCOSYLATED HEMOGLOBIN (HGB A1C): Hemoglobin A1C: 7.2 % — AB (ref 4.0–5.6)

## 2024-09-21 MED ORDER — ZEPBOUND 7.5 MG/0.5ML ~~LOC~~ SOAJ: 7.5000 mg | SUBCUTANEOUS | 1 refills | Status: AC

## 2024-09-21 MED ORDER — ZEPBOUND 5 MG/0.5ML ~~LOC~~ SOAJ: 5.0000 mg | SUBCUTANEOUS | 0 refills | Status: AC

## 2024-09-21 NOTE — Progress Notes (Unsigned)
 "  Established Patient Office Visit  Subjective   Patient ID: Ariana Lindsey, female    DOB: [birth year]  Age: 44 y.o. MRN: [id number]  Chief Complaint  Patient presents with   Follow-up    HPI Discussed the use of AI scribe software for clinical note transcription with the patient, who gave verbal consent to proceed.  History of Present Illness   Ariana Lindsey is a 44 year old female with diabetes who presents for medication management and blood sugar control.  She has been taking 2.5 mg of Zepbound  without experiencing any side effects. She initially believed she gained two pounds but was informed she lost a pound and a half. She describes a sensation of hunger in her stomach but not in her head. She is also on metformin  100mg  BID and Farxiga  10mg  daily.    She monitors her blood sugar levels regularly. Last night, her blood sugar was 119 mg/dL before bed, increased to 202 mg/dL upon waking, and then dropped to 109 mg/dL an hour later. Her blood sugar tends to be higher in the morning and decreases within an hour of waking, despite not eating during that time. She is currently taking 50 units of Lantus  insulin  daily. Her last A1c test was four months ago.  She expresses frustration with her diabetes management but denies depression, attributing her emotional response to aggravation with her condition.  Regarding physical activity, she attempts to walk twice a week but has been limited by cold weather and a broken walking pad. She has fallen twice in the past week and a half due to snow, which has made her cautious about walking outside. She tries to stay active by using stairs at work and parking further away from the building.       Objective:     BP 129/82   Pulse (!) 102   Ht 5' 6 (1.676 m)   Wt 238 lb 8 oz (108.2 kg)   LMP 05/10/2015   SpO2 96%   BMI 38.49 kg/m  {Vitals History (Optional):23777}  Physical Exam Vitals and nursing note reviewed.  Constitutional:       Appearance: Normal appearance.  HENT:     Head: Normocephalic and atraumatic.  Eyes:     General:        Right eye: No discharge.        Left eye: No discharge.     Conjunctiva/sclera: Conjunctivae normal.  Cardiovascular:     Rate and Rhythm: Normal rate and regular rhythm.  Pulmonary:     Effort: Pulmonary effort is normal.     Breath sounds: Normal breath sounds.  Musculoskeletal:     Right lower leg: No edema.     Left lower leg: No edema.  Skin:    General: Skin is warm and dry.  Neurological:     Mental Status: She is alert and oriented to person, place, and time.  Psychiatric:        Mood and Affect: Mood normal.        Behavior: Behavior normal.        Thought Content: Thought content normal.        Judgment: Judgment normal.      {Perform Simple Foot Exam  Perform Detailed exam:1} {Insert foot Exam (Optional):30965}   No results found for any visits on 09/21/24.  {Labs (Optional):23779}  The ASCVD Risk score (Arnett DK, et al., 2019) failed to calculate for the following reasons:   The valid total cholesterol  range is 130 to 320 mg/dL    Assessment & Plan:  Type 2 diabetes mellitus with hyperglycemia, with long-term current use of insulin  (HCC) -     Zepbound ; Inject 5 mg into the skin once a week.  Dispense: 2 mL; Refill: 0 -     Zepbound ; Inject 7.5 mg into the skin once a week.  Dispense: 2 mL; Refill: 1     Return in about 3 months (around 12/19/2024).    Haillee Johann K Tan Clopper, MD "

## 2024-09-24 ENCOUNTER — Encounter

## 2024-12-21 ENCOUNTER — Ambulatory Visit: Admitting: Family Medicine
